# Patient Record
Sex: Female | Born: 1995 | Race: Black or African American | Hispanic: No | Marital: Single | Smoking: Never smoker
Health system: Southern US, Community
[De-identification: ages and names within clinical notes are randomized; demographics above are authoritative.]

## PROBLEM LIST (undated history)

## (undated) ENCOUNTER — Inpatient Hospital Stay: Payer: Self-pay

## (undated) DIAGNOSIS — I10 Essential (primary) hypertension: Secondary | ICD-10-CM

## (undated) DIAGNOSIS — D649 Anemia, unspecified: Secondary | ICD-10-CM

---
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
## 2014-05-19 ENCOUNTER — Ambulatory Visit
Admit: 2014-05-19 | Disposition: A | Discharge status: Home / Self Care | Payer: Self-pay | Attending: Family Medicine | Admitting: Family Medicine

## 2014-05-19 LAB — URINALYSIS, COMPLETE
Bacteria: NEGATIVE
Bilirubin,UR: NEGATIVE
Blood: NEGATIVE
Glucose,UR: NEGATIVE
Ketone: NEGATIVE
Leukocyte Esterase: NEGATIVE
NITRITE: NEGATIVE
PROTEIN: NEGATIVE
Ph: 6 (ref 5.0–8.0)
RBC,UR: NONE SEEN /HPF (ref 0–5)
SPECIFIC GRAVITY: 1.025 (ref 1.000–1.030)
Squamous Epithelial: NONE SEEN

## 2014-05-19 LAB — PREGNANCY, URINE: Pregnancy Test, Urine: NEGATIVE m[IU]/mL

## 2014-05-19 LAB — HCG, QUANTITATIVE, PREGNANCY

## 2015-06-15 DIAGNOSIS — I10 Essential (primary) hypertension: Secondary | ICD-10-CM | POA: Insufficient documentation

## 2015-10-11 ENCOUNTER — Emergency Department
Admission: EM | Admit: 2015-10-11 | Discharge: 2015-10-11 | Disposition: A | Discharge status: Home / Self Care | Payer: BC Managed Care – PPO | Attending: Emergency Medicine | Admitting: Emergency Medicine

## 2015-10-11 ENCOUNTER — Encounter: Payer: Self-pay | Admitting: Emergency Medicine

## 2015-10-11 DIAGNOSIS — Z3A00 Weeks of gestation of pregnancy not specified: Secondary | ICD-10-CM | POA: Insufficient documentation

## 2015-10-11 DIAGNOSIS — R51 Headache: Secondary | ICD-10-CM | POA: Diagnosis not present

## 2015-10-11 DIAGNOSIS — O26899 Other specified pregnancy related conditions, unspecified trimester: Secondary | ICD-10-CM | POA: Insufficient documentation

## 2015-10-11 DIAGNOSIS — Z3201 Encounter for pregnancy test, result positive: Secondary | ICD-10-CM | POA: Diagnosis not present

## 2015-10-11 DIAGNOSIS — R519 Headache, unspecified: Secondary | ICD-10-CM

## 2015-10-11 LAB — CBC WITH DIFFERENTIAL/PLATELET
Basophils Absolute: 0.1 10*3/uL (ref 0–0.1)
Basophils Relative: 1 %
EOS ABS: 0.1 10*3/uL (ref 0–0.7)
EOS PCT: 1 %
HCT: 37.2 % (ref 35.0–47.0)
Hemoglobin: 12.4 g/dL (ref 12.0–16.0)
LYMPHS ABS: 2.3 10*3/uL (ref 1.0–3.6)
Lymphocytes Relative: 36 %
MCH: 25.2 pg — ABNORMAL LOW (ref 26.0–34.0)
MCHC: 33.4 g/dL (ref 32.0–36.0)
MCV: 75.5 fL — ABNORMAL LOW (ref 80.0–100.0)
MONO ABS: 0.4 10*3/uL (ref 0.2–0.9)
Monocytes Relative: 6 %
Neutro Abs: 3.5 10*3/uL (ref 1.4–6.5)
Neutrophils Relative %: 56 %
PLATELETS: 305 10*3/uL (ref 150–440)
RBC: 4.92 MIL/uL (ref 3.80–5.20)
RDW: 15.7 % — ABNORMAL HIGH (ref 11.5–14.5)
WBC: 6.4 10*3/uL (ref 3.6–11.0)

## 2015-10-11 LAB — COMPREHENSIVE METABOLIC PANEL
ALT: 15 U/L (ref 14–54)
ANION GAP: 7 (ref 5–15)
AST: 21 U/L (ref 15–41)
Albumin: 4.3 g/dL (ref 3.5–5.0)
Alkaline Phosphatase: 60 U/L (ref 38–126)
BUN: 11 mg/dL (ref 6–20)
CO2: 25 mmol/L (ref 22–32)
CREATININE: 0.81 mg/dL (ref 0.44–1.00)
Calcium: 9 mg/dL (ref 8.9–10.3)
Chloride: 103 mmol/L (ref 101–111)
GFR calc Af Amer: >60 mL/min (ref >60)
GFR calc non Af Amer: >60 mL/min (ref >60)
Glucose, Bld: 102 mg/dL — ABNORMAL HIGH (ref 65–99)
Potassium: 3.2 mmol/L — ABNORMAL LOW (ref 3.5–5.1)
SODIUM: 135 mmol/L (ref 135–145)
Total Bilirubin: 0.3 mg/dL (ref 0.3–1.2)
Total Protein: 8.2 g/dL — ABNORMAL HIGH (ref 6.5–8.1)

## 2015-10-11 LAB — URINALYSIS COMPLETE WITH MICROSCOPIC (ARMC ONLY)
BACTERIA UA: NONE SEEN
Bilirubin Urine: NEGATIVE
Glucose, UA: NEGATIVE mg/dL
HGB URINE DIPSTICK: NEGATIVE
KETONES UR: NEGATIVE mg/dL
Leukocytes, UA: NEGATIVE
NITRITE: NEGATIVE
PROTEIN: NEGATIVE mg/dL
SPECIFIC GRAVITY, URINE: 1.025 (ref 1.005–1.030)
pH: 6 (ref 5.0–8.0)

## 2015-10-11 LAB — POCT PREGNANCY, URINE
PREG TEST UR: NEGATIVE
Preg Test, Ur: POSITIVE — AB

## 2015-10-11 LAB — HCG, QUANTITATIVE, PREGNANCY: HCG, BETA CHAIN, QUANT, S: 412 m[IU]/mL — AB (ref <5)

## 2015-10-11 MED ORDER — ACETAMINOPHEN 325 MG PO TABS
650.0000 mg | ORAL_TABLET | Freq: Once | ORAL | Status: AC
  Administered 2015-10-11: 650 mg via ORAL

## 2015-10-11 MED ORDER — ACETAMINOPHEN 325 MG PO TABS
ORAL_TABLET | ORAL | Status: AC
  Administered 2015-10-11: 650 mg via ORAL
  Filled 2015-10-11: qty 2

## 2015-10-11 NOTE — ED Notes (Signed)
Discharge instructions reviewed with patient. Patient verbalized understanding. Patient ambulated to lobby without difficulty.   

## 2015-10-11 NOTE — Discharge Instructions (Signed)
You were evaluated for headache and no certain cause is found, although as we discussed your exam and evaluation are reassuring today. I suspect that hormonal changes may be contributing to her headache. Make sure drinking plenty of fluids.  Your pregnancy test was positive. As we discussed, if you have any abdominal pain, vaginal bleeding please come back to the emergency department for further evaluation.  You should follow up with outpatient OB/GYN, and you were provided information for follow-up with Dr. Feliberto GottronSchermerhorn. You should take over-the-counter prenatal vitamins. For pain, you should only be taking Tylenol, you may take as directed on label of over-the-counter Tylenol bottle.  In terms of the headache, return to the emergency department for any fever, neck stiffness, skin rash, worsening headache, vision changes, weakness, numbness, or any other symptoms concerning to you.

## 2015-10-11 NOTE — ED Triage Notes (Signed)
Headache and lightheaded x 3 days. Denies head injury. Denies sinus drainage. Denies fevers. Denies being one sided. Denies photophobia.

## 2015-10-11 NOTE — ED Triage Notes (Signed)
Patient shows faint positive line on pregnancy urine test after 3 minute mark.

## 2015-10-11 NOTE — ED Provider Notes (Signed)
[facility] Emergency Department Provider Note ____________________________________________   I have reviewed the triage vital signs and the triage nursing note.  HISTORY  Chief Complaint Headache   Historian Patient  HPI Jean Wise is a 20 y.o. female here for evaluation of generalized headache which is intermittent for the past 3 days. No photophobia. No sinus congestion. No fever. No vomiting. No weakness or numbness. No vision changes. She has not really tried anything at home for this. Pain is mild to moderate. Nothing makes it worse or better.  Miscarriage in April. Periods had returned.    History reviewed. No pertinent past medical history.  There are no active problems to display for this patient.   History reviewed. No pertinent surgical history.  Prior to Admission medications   Not on File    Allergies no known allergies  No family history on file.  Social History Social History  Substance Use Topics  . Smoking status: Never Smoker  . Smokeless tobacco: Not on file  . Alcohol use No    Review of Systems  Constitutional: Negative for fever. Eyes: Negative for visual changes. ENT: Negative for sore throat. Cardiovascular: Negative for chest pain. Respiratory: Negative for shortness of breath. Gastrointestinal: Negative for abdominal pain, vomiting and diarrhea. Genitourinary: Negative for dysuria. Musculoskeletal: Negative for back pain. Skin: Negative for rash. Neurological: Positive for headache. 10 point Review of Systems otherwise negative ____________________________________________   PHYSICAL EXAM:  VITAL SIGNS: ED Triage Vitals  Enc Vitals Group     BP 10/11/15 1644 (!) 154/80     Pulse Rate 10/11/15 1644 (!) 106     Resp 10/11/15 1644 18     Temp 10/11/15 1644 98.3 F (36.8 C)     Temp Source 10/11/15 1644 Oral     SpO2 10/11/15 1644 100 %     Weight 10/11/15 1646 140 lb (63.5 kg)     Height  10/11/15 1646 5\' 2"  (1.575 m)     Head Circumference --      Peak Flow --      Pain Score 10/11/15 1646 6     Pain Loc --      Pain Edu? --      Excl. in GC? --      Constitutional: Alert and oriented. Well appearing and in no distress. HEENT   Head: Normocephalic and atraumatic.      Eyes: Conjunctivae are normal. PERRL. Normal extraocular movements.      Ears:         Nose: No congestion/rhinnorhea.   Mouth/Throat: Mucous membranes are moist.   Neck: No stridor. No neck stiffness. Cardiovascular/Chest: Normal rate, regular rhythm.  No murmurs, rubs, or gallops. Respiratory: Normal respiratory effort without tachypnea nor retractions. Breath sounds are clear and equal bilaterally. No wheezes/rales/rhonchi. Gastrointestinal: Soft. No distention, no guarding, no rebound. Nontender.    Genitourinary/rectal:Deferred Musculoskeletal: Nontender with normal range of motion in all extremities. No joint effusions.  No lower extremity tenderness.  No edema. Neurologic:  Normal speech and language. No gross or focal neurologic deficits are appreciated. Skin:  Skin is warm, dry and intact. No rash noted. Psychiatric: Mood and affect are normal. Speech and behavior are normal. Patient exhibits appropriate insight and judgment.   ____________________________________________  LABS (pertinent positives/negatives)  Labs Reviewed  HCG, QUANTITATIVE, PREGNANCY - Abnormal; Notable for the following:       Result Value   hCG, Beta Chain, Quant, S 412 (*)    All other  components within normal limits  CBC WITH DIFFERENTIAL/PLATELET - Abnormal; Notable for the following:    MCV 75.5 (*)    MCH 25.2 (*)    RDW 15.7 (*)    All other components within normal limits  COMPREHENSIVE METABOLIC PANEL - Abnormal; Notable for the following:    Potassium 3.2 (*)    Glucose, Bld 102 (*)    Total Protein 8.2 (*)    All other components within normal limits  URINALYSIS COMPLETEWITH MICROSCOPIC  ([facility] ONLY) - Abnormal; Notable for the following:    Color, Urine YELLOW (*)    APPearance CLEAR (*)    Squamous Epithelial / LPF 0-5 (*)    All other components within normal limits  POCT PREGNANCY, URINE - Abnormal; Notable for the following:    Preg Test, Ur POSITIVE (*)    All other components within normal limits  POCT PREGNANCY, URINE  POC URINE PREG, ED    ____________________________________________    EKG I, Governor Rooks, MD, the attending physician have personally viewed and interpreted all ECGs.  None ____________________________________________  RADIOLOGY All Xrays were viewed by me. Imaging interpreted by Radiologist.  None __________________________________________  PROCEDURES  Procedure(s) performed: None  Critical Care performed: None  ____________________________________________   ED COURSE / ASSESSMENT AND PLAN  Pertinent labs & imaging results that were available during my care of the patient were reviewed by me and considered in my medical decision making (see chart for details).  [name] was here for headache, no red flags/high risk features. Headache is mild now. She was found to have positive urine pregnancy test and beta hCG in the 400s. She states that she's had some occasional cramping, but no abdominal pain now. I discussed with her that she is likely early pregnant, and I would not necessarily recommend ultrasound imaging at this point in time with no discomfort. She should follow with OB/GYN and was provided office number for follow-up with Dr. Feliberto Gottron. She previously had been seen at [facility] after miscarriage, and she was recommended that she could go if there are she wanted to.  In terms of the headache, I encouraged her to take Tylenol as is the safest and pregnancy. I don't think she needs any head imaging at this point in time.     CONSULTATIONS:   None   Patient / Family / Caregiver informed of clinical course, medical  decision-making process, and agree with plan.   I discussed return precautions, follow-up instructions, and discharge instructions with patient and/or family.   ___________________________________________   FINAL CLINICAL IMPRESSION(S) / ED DIAGNOSES   Final diagnoses:  Headache, unspecified headache type  Positive blood pregnancy test              Note: This dictation was prepared with Dragon dictation. Any transcriptional errors that result from this process are unintentional    Governor Rooks, MD 10/11/15 1912

## 2016-02-05 ENCOUNTER — Emergency Department: Payer: Medicaid Other

## 2016-02-05 ENCOUNTER — Encounter: Payer: Self-pay | Admitting: Emergency Medicine

## 2016-02-05 ENCOUNTER — Emergency Department
Admission: EM | Admit: 2016-02-05 | Discharge: 2016-02-05 | Disposition: A | Discharge status: Other Acute Inpatient Hospital | Payer: Medicaid Other | Attending: Emergency Medicine | Admitting: Emergency Medicine

## 2016-02-05 DIAGNOSIS — R079 Chest pain, unspecified: Secondary | ICD-10-CM

## 2016-02-05 DIAGNOSIS — Z3492 Encounter for supervision of normal pregnancy, unspecified, second trimester: Secondary | ICD-10-CM

## 2016-02-05 DIAGNOSIS — M545 Low back pain: Secondary | ICD-10-CM | POA: Insufficient documentation

## 2016-02-05 DIAGNOSIS — Z3A20 20 weeks gestation of pregnancy: Secondary | ICD-10-CM | POA: Diagnosis not present

## 2016-02-05 DIAGNOSIS — O26892 Other specified pregnancy related conditions, second trimester: Secondary | ICD-10-CM | POA: Insufficient documentation

## 2016-02-05 DIAGNOSIS — R0602 Shortness of breath: Secondary | ICD-10-CM | POA: Diagnosis not present

## 2016-02-05 LAB — COMPREHENSIVE METABOLIC PANEL
ALT: 15 U/L (ref 14–54)
AST: 22 U/L (ref 15–41)
Albumin: 3.2 g/dL — ABNORMAL LOW (ref 3.5–5.0)
Alkaline Phosphatase: 53 U/L (ref 38–126)
Anion gap: 5 (ref 5–15)
BUN: <5 mg/dL — ABNORMAL LOW (ref 6–20)
CO2: 22 mmol/L (ref 22–32)
Calcium: 8.5 mg/dL — ABNORMAL LOW (ref 8.9–10.3)
Chloride: 108 mmol/L (ref 101–111)
Creatinine, Ser: 0.58 mg/dL (ref 0.44–1.00)
GFR calc non Af Amer: >60 mL/min (ref >60)
Glucose, Bld: 91 mg/dL (ref 65–99)
POTASSIUM: 3.1 mmol/L — AB (ref 3.5–5.1)
Sodium: 135 mmol/L (ref 135–145)
Total Bilirubin: 0.4 mg/dL (ref 0.3–1.2)
Total Protein: 6.4 g/dL — ABNORMAL LOW (ref 6.5–8.1)

## 2016-02-05 LAB — TROPONIN I

## 2016-02-05 LAB — CBC
HCT: 28.9 % — ABNORMAL LOW (ref 35.0–47.0)
Hemoglobin: 10 g/dL — ABNORMAL LOW (ref 12.0–16.0)
MCH: 28.5 pg (ref 26.0–34.0)
MCHC: 34.8 g/dL (ref 32.0–36.0)
MCV: 81.9 fL (ref 80.0–100.0)
PLATELETS: 245 10*3/uL (ref 150–440)
RBC: 3.53 MIL/uL — ABNORMAL LOW (ref 3.80–5.20)
RDW: 14.4 % (ref 11.5–14.5)
WBC: 8.4 10*3/uL (ref 3.6–11.0)

## 2016-02-05 LAB — FIBRIN DERIVATIVES D-DIMER (ARMC ONLY): Fibrin derivatives D-dimer (ARMC): 1641 — ABNORMAL HIGH (ref 0–499)

## 2016-02-05 NOTE — ED Notes (Signed)
NAD noted at this time. Pt resting in bed. This RN notified MD about patient requesting to know if CT would be detrimental to her baby. MD to bedside at this time to discuss with patient. Will continue to monitor for further patient needs.

## 2016-02-05 NOTE — ED Notes (Addendum)
Patient's mom to desk at this time, [facility] at this time. This RN to bedside, [facility]. This RN explained to patient will notify MD at this time. MD noted to not be at desk. Pt visualized in NAD at this time. VSS and WNL. Pt's family at bedside at this time.

## 2016-02-05 NOTE — ED Notes (Signed)
This RN to bedside at this time. Per Dr. Fanny BienQuale pt has been [facility]. This RN informed patient and family. Pt's family asking for estimated time of transfer, this RN informed patient and family that she was unable to provide exact time EMS would arrive, pt and family state understanding at this time. Pt visualized in NAD. Will continue to monitor for further patient needs.

## 2016-02-05 NOTE — ED Notes (Signed)
This RN at bedside at this time. NAD noted. This RN placed 20G to L AC to draw blood. Pt tolerated well. Explained delay to patient. Will continue to monitor for further patient needs.

## 2016-02-05 NOTE — ED Notes (Signed)
Pt resting in bed. Pt given a cup of water per her request, this RN spoke with Dr. Fanny BienQuale regarding patient being able to eat or drink. Per Dr. Fanny BienQuale pt is able to have a cup of water at this time. Pt visualized in NAD, respirations even and unlabored, pt's friends and family at bedside at this time. Will continue to monitor for further patient needs.

## 2016-02-05 NOTE — ED Notes (Signed)
NAD noted at time of transfer. Pt is alert and oriented, respirations even and unlabored. Pt to be transferred via [facility] who [facility] arrived [facility]

## 2016-02-05 NOTE — ED Triage Notes (Signed)
Pt c/o intermittent SOB x1 week, worse today. Pt is [redacted] wks pregnant. Pt is in NAD in triage.

## 2016-02-05 NOTE — ED Provider Notes (Signed)
[facility] Emergency Department Provider Note   ____________________________________________    I have reviewed the triage vital signs and the nursing notes.   HISTORY  Chief Complaint Shortness of Breath     HPI Jean Wise is a 21 y.o. female who presents with complaints of shortness of breath and chest discomfort. Patient reports when she takes deep she has pain in her right chest. She feels very winded with any exertion. She is approximately [redacted] weeks pregnant. She denies recent travel. She denies calf pain or swelling. No fevers or chills or cough. No nausea or vomiting. She has never had before   History reviewed. No pertinent past medical history.  There are no active problems to display for this patient.   History reviewed. No pertinent surgical history.  Prior to Admission medications   Not on File     Allergies Patient has no known allergies.  History reviewed. No pertinent family history.  Social History Social History  Substance Use Topics  . Smoking status: Never Smoker  . Smokeless tobacco: Never Used  . Alcohol use No    Review of Systems  Constitutional: No fever/chills  ENT: No Throat swelling Cardiovascular: As above Respiratory: As above Gastrointestinal: No abdominal pain.  No nausea, no vomiting.    Musculoskeletal: Mild low back pain Skin: Negative for rash. Neurological: Negative for headaches  10-point ROS otherwise negative.  ____________________________________________   PHYSICAL EXAM:  VITAL SIGNS: ED Triage Vitals  Enc Vitals Group     BP 02/05/16 1154 139/86     Pulse Rate 02/05/16 1154 96     Resp 02/05/16 1154 20     Temp 02/05/16 1154 98.4 F (36.9 C)     Temp Source 02/05/16 1154 Oral     SpO2 02/05/16 1154 100 %     Weight 02/05/16 1156 158 lb (71.7 kg)     Height 02/05/16 1156 5\' 2"  (1.575 m)     Head Circumference --      Peak Flow --      Pain Score --      Pain Loc  --      Pain Edu? --      Excl. in GC? --     Constitutional: Alert and oriented. No acute distress. Pleasant and interactive Eyes: Conjunctivae are normal.   Nose: No congestion/rhinnorhea. Mouth/Throat: Mucous membranes are moist.    Cardiovascular: Tachycardia, regular rhythm. Grossly normal heart sounds.  Good peripheral circulation. Respiratory: Mildly increased respiratory effort with mild tachypnea  No retractions. Lungs CTAB. Gastrointestinal: Soft and nontender. No distention.  No CVA tenderness. Genitourinary: deferred Musculoskeletal: No lower extremity tenderness nor edema.  Warm and well perfused Neurologic:  Normal speech and language. No gross focal neurologic deficits are appreciated.  Skin:  Skin is warm, dry and intact. No rash noted. Psychiatric: Mood and affect are normal. Speech and behavior are normal.  ____________________________________________   LABS (all labs ordered are listed, but only abnormal results are displayed)  Labs Reviewed  CBC - Abnormal; Notable for the following:       Result Value   RBC 3.53 (*)    Hemoglobin 10.0 (*)    HCT 28.9 (*)    All other components within normal limits  COMPREHENSIVE METABOLIC PANEL - Abnormal; Notable for the following:    Potassium 3.1 (*)    BUN <5 (*)    Calcium 8.5 (*)    Total Protein 6.4 (*)    Albumin  3.2 (*)    All other components within normal limits  FIBRIN DERIVATIVES D-DIMER ([facility] ONLY) - Abnormal; Notable for the following:    Fibrin derivatives D-dimer [facility]) 1,641 (*)    All other components within normal limits  TROPONIN I   ____________________________________________  EKG  ED ECG REPORT I, Jene Every, the attending physician, personally viewed and interpreted this ECG.  Date: 02/05/2016 EKG Time: 12:04 PM Rate: 95 Rhythm: normal sinus rhythm QRS Axis: normal Intervals: normal ST/T Wave abnormalities:  Nonspecific   ____________________________________________  RADIOLOGY  Chest x-ray unremarkable ____________________________________________   PROCEDURES  Procedure(s) performed: No    Critical Care performed: No ____________________________________________   INITIAL IMPRESSION / ASSESSMENT AND PLAN / ED COURSE  Pertinent labs & imaging results that were available during my care of the patient were reviewed by me and considered in my medical decision making (see chart for details).  Patient presents with shortness of breath and pleurisy. Chest x-ray is unremarkable. Lab work is reassuring except for an elevated d-dimer.   Clinical Course    ___----------------------------------------- 3:06 PM on 02/05/2016 -----------------------------------------  Patient continues to complain of pleurisy and shortness of breath. Given her elevated d-dimer and description of pleurisy and initial tachycardia I discussed with her whether to proceed with further imaging. We discussed increased radiation exposure to fetus and risks and benefits. Via shared decision making mother decided to proceed with CT scan.   I will ask Dr. Fanny Bien to follow up on Ct results and dispo as appropriate.    _________________________________________   FINAL CLINICAL IMPRESSION(S) / ED DIAGNOSES  Shortness of breath.    NEW MEDICATIONS STARTED DURING THIS VISIT:  New Prescriptions   No medications on file     Note:  This document was prepared using Dragon voice recognition software and may include unintentional dictation errors.    Jene Every, MD 02/05/16 [phone]

## 2016-02-05 NOTE — ED Notes (Signed)
This RN notified CT scan that patient was continuing to refuse CT until her mom arrived, will continue to wait for patient's mother to arrive before decision is made.

## 2016-02-05 NOTE — ED Notes (Signed)
This RN called report to Shanda BumpsJessica, Charity fundraiserN.

## 2016-02-05 NOTE — ED Provider Notes (Signed)
The patient refused CAT scan. She does wish for further workup of her chest pain, and understands the reason and that we are offering evaluation here including a CT scan of the chest to make sure she does not have a "blood clot" as a cause or chest pain. The patient reports that due to previous family experience with our hospital they will refuse further care here and they're [facility]. Neither the patient or her family seem upset with the care provided in the ER here tonight, but rather reports that they are much more comfortable receiving further care given her being [facility].  I discussed with ER attending, [facility]. Elesa [facility]ackerJonathan [facility]. He is accepted the patient in transfer, she will be transferred and is agreeable to transfer by ambulance BLS level. She appears stable for transfer at this time, has no oxygen requirement, is resting comfortably, able to speak on her phone. Lungs are clear. Stable hemodynamics.  Vitals:   02/05/16 1700 02/05/16 1730  BP: (!) 149/84 (!) 143/78  Pulse: 93 86  Resp: 17 14  Temp:      ----------------------------------------- 6:06 PM on 02/05/2016 -----------------------------------------  [facility], [facility] [place] [facility] for further workup of chest pain and pregnancy.   Sharyn CreamerMark Laycee Fitzsimmons, MD 02/05/16 1806

## 2016-02-05 NOTE — ED Notes (Signed)
Pt resting in bed at this time. Pt given a food tray by April, RN. Per Dr. Fanny BienQuale, pt is okay to eat at this time. NAD noted at this time.

## 2016-02-05 NOTE — ED Notes (Signed)
This RN explained delay and apologized to patient for wait. Pt states understanding. Pt given 2 warm sheets (explained to patient that we are out of blankets). Pt visualized in NAD at this time. Will continue to monitor for further patient needs.

## 2016-05-24 ENCOUNTER — Observation Stay
Admission: EM | Admit: 2016-05-24 | Discharge: 2016-05-24 | Disposition: A | Discharge status: Home / Self Care | Payer: Medicaid Other | Attending: Obstetrics and Gynecology | Admitting: Obstetrics and Gynecology

## 2016-05-24 DIAGNOSIS — O10911 Unspecified pre-existing hypertension complicating pregnancy, first trimester: Principal | ICD-10-CM | POA: Insufficient documentation

## 2016-05-24 DIAGNOSIS — Z6834 Body mass index (BMI) 34.0-34.9, adult: Secondary | ICD-10-CM | POA: Insufficient documentation

## 2016-05-24 DIAGNOSIS — E663 Overweight: Secondary | ICD-10-CM | POA: Diagnosis not present

## 2016-05-24 DIAGNOSIS — Z3A08 8 weeks gestation of pregnancy: Secondary | ICD-10-CM | POA: Diagnosis not present

## 2016-05-24 MED ORDER — ACETAMINOPHEN 325 MG PO TABS: 650.0000 mg | ORAL_TABLET | ORAL | Status: DC | PRN

## 2016-05-24 MED ORDER — LACTATED RINGERS IV SOLN: 500.0000 mL | INTRAVENOUS | Status: DC | PRN

## 2016-05-24 MED ORDER — OXYCODONE-ACETAMINOPHEN 5-325 MG PO TABS: 1.0000 | ORAL_TABLET | ORAL | Status: DC | PRN

## 2016-05-24 NOTE — OB Triage Note (Signed)
Jean Wise here with c/o LOF last night at 2300, stated she got nauseated, vomited and while she was vomiting a gush of fluid came out, since then , she reports "white stuff" coming out. Denies bleeding, reports positive fetal movement. Last intercourse yesterday AM.

## 2016-05-24 NOTE — Discharge Instructions (Signed)
Braxton Hicks Contractions °Contractions of the uterus can occur throughout pregnancy, but they are not always a sign that you are in labor. You may have practice contractions called Braxton Hicks contractions. These false labor contractions are sometimes confused with true labor. °What are Braxton Hicks contractions? °Braxton Hicks contractions are tightening movements that occur in the muscles of the uterus before labor. Unlike true labor contractions, these contractions do not result in opening (dilation) and thinning of the cervix. Toward the end of pregnancy (32-34 weeks), Braxton Hicks contractions can happen more often and may become stronger. These contractions are sometimes difficult to tell apart from true labor because they can be very uncomfortable. You should not feel embarrassed if you go to the hospital with false labor. °Sometimes, the only way to tell if you are in true labor is for your health care provider to look for changes in the cervix. The health care provider will do a physical exam and may monitor your contractions. If you are not in true labor, the exam should show that your cervix is not dilating and your water has not broken. °If there are no prenatal problems or other health problems associated with your pregnancy, it is completely safe for you to be sent home with false labor. You may continue to have Braxton Hicks contractions until you go into true labor. °How can I tell the difference between true labor and false labor? °· Differences °¨ False labor °¨ Contractions last 30-70 seconds.: Contractions are usually shorter and not as strong as true labor contractions. °¨ Contractions become very regular.: Contractions are usually irregular. °¨ Discomfort is usually felt in the top of the uterus, and it spreads to the lower abdomen and low back.: Contractions are often felt in the front of the lower abdomen and in the groin. °¨ Contractions do not go away with walking.: Contractions may  go away when you walk around or change positions while lying down. °¨ Contractions usually become more intense and increase in frequency.: Contractions get weaker and are shorter-lasting as time goes on. °¨ The cervix dilates and gets thinner.: The cervix usually does not dilate or become thin. °Follow these instructions at home: °¨ Take over-the-counter and prescription medicines only as told by your health care provider. °¨ Keep up with your usual exercises and follow other instructions from your health care provider. °¨ Eat and drink lightly if you think you are going into labor. °¨ If Braxton Hicks contractions are making you uncomfortable: °¨ Change your position from lying down or resting to walking, or change from walking to resting. °¨ Sit and rest in a tub of warm water. °¨ Drink enough fluid to keep your urine clear or pale yellow. Dehydration may cause these contractions. °¨ Do slow and deep breathing several times an hour. °¨ Keep all follow-up prenatal visits as told by your health care provider. This is important. °Contact a health care provider if: °¨ You have a fever. °¨ You have continuous pain in your abdomen. °Get help right away if: °¨ Your contractions become stronger, more regular, and closer together. °¨ You have fluid leaking or gushing from your vagina. °¨ You pass blood-tinged mucus (bloody show). °¨ You have bleeding from your vagina. °¨ You have low back pain that you never had before. °¨ You feel your baby’s head pushing down and causing pelvic pressure. °¨ Your baby is not moving inside you as much as it used to. °Summary °¨ Contractions that occur before labor are   called Braxton Hicks contractions, false labor, or practice contractions.  Braxton Hicks contractions are usually shorter, weaker, farther apart, and less regular than true labor contractions. True labor contractions usually become progressively stronger and regular and they become more frequent.  Manage discomfort from  [facility] contractions by changing position, resting in a warm bath, drinking plenty of water, or practicing deep breathing. This information is not intended to replace advice given to you by your health care provider. Make sure you discuss any questions you have with your health care provider. Document Released: 01/16/2005 Document Revised: 12/06/2015 Document Reviewed: 12/06/2015 Elsevier Interactive Patient Education  2017 [employer]   Vaginal Delivery Vaginal delivery means that you will give birth by pushing your baby out of your birth canal (vagina). A team of health care providers will help you before, during, and after vaginal delivery. Birth experiences are unique for every woman and every pregnancy, and birth experiences vary depending on where you choose to give birth. What should I do to prepare for my baby's birth? Before your baby is born, it is important to talk with your health care provider about:  Your labor and delivery preferences. These may include:  Medicines that you may be given.  How you will manage your pain. This might include non-medical pain relief techniques or injectable pain relief such as epidural analgesia.  How you and your baby will be monitored during labor and delivery.  Who may be in the labor and delivery room with you.  Your feelings about surgical delivery of your baby (cesarean delivery, or C-section) if this becomes necessary.  Your feelings about receiving donated blood through an IV tube (blood transfusion) if this becomes necessary.  Whether you are able:  To take pictures or videos of the birth.  To eat during labor and delivery.  To move around, walk, or change positions during labor and delivery.  What to expect after your baby is born, such as:  Whether delayed umbilical cord clamping and cutting is offered.  Who will care for your baby right after birth.  Medicines or tests that may be recommended for your  baby.  Whether breastfeeding is supported in your hospital or birth center.  How [place] you will be in the hospital or birth center.  How any medical conditions you have may affect your baby or your labor and delivery experience. To prepare for your baby's birth, you should also:  Attend all of your health care visits before delivery (prenatal visits) as recommended by your health care provider. This is important.  Prepare your home for your baby's arrival. Make sure that you have:  Diapers.  Baby clothing.  Feeding equipment.  Safe sleeping arrangements for you and your baby.  Install a car seat in your vehicle. Have your car seat checked by a certified car seat installer to make sure that it is installed safely.  Think about who will help you with your new baby at home for at least the first several weeks after delivery. What can I expect when I arrive at the birth center or hospital? Once you are in labor and have been admitted into the hospital or birth center, your health care provider may:  Review your pregnancy history and any concerns you have.  Insert an IV tube into one of your veins. This is used to give you fluids and medicines.  Check your blood pressure, pulse, temperature, and heart rate (vital signs).  Check whether your bag of water (amniotic  sac) has broken (ruptured).  Talk with you about your birth plan and discuss pain control options. Monitoring  Your health care provider may monitor your contractions (uterine monitoring) and your baby's heart rate (fetal monitoring). You may need to be monitored:  Often, but not continuously (intermittently).  All the time or for [place] periods at a time (continuously). Continuous monitoring may be needed if:  You are taking certain medicines, such as medicine to relieve pain or make your contractions stronger.  You have pregnancy or labor complications. Monitoring may be done by:  Placing a special stethoscope or a  handheld monitoring device on your abdomen to check your baby's heartbeat, and feeling your abdomen for contractions. This method of monitoring does not continuously record your baby's heartbeat or your contractions.  Placing monitors on your abdomen (external monitors) to record your baby's heartbeat and the frequency and length of contractions. You may not have to wear external monitors all the time.  Placing monitors inside of your uterus (internal monitors) to record your baby's heartbeat and the frequency, length, and strength of your contractions.  Your health care provider may use internal monitors if he or she needs more information about the strength of your contractions or your baby's heart rate.  Internal monitors are put in place by passing a thin, flexible wire through your vagina and into your uterus. Depending on the type of monitor, it may remain in your uterus or on your baby's head until birth.  Your health care provider will discuss the benefits and risks of internal monitoring with you and will ask for your permission before inserting the monitors.  Telemetry. This is a type of continuous monitoring that can be done with external or internal monitors. Instead of having to stay in bed, you are able to move around during telemetry. Ask your health care provider if telemetry is an option for you. Physical exam  Your health care provider may perform a physical exam. This may include:  Checking whether your baby is positioned:  With the head toward your vagina (head-down). This is most common.  With the head toward the top of your uterus (head-up or breech). If your baby is in a breech position, your health care provider may try to turn your baby to a head-down position so you can deliver vaginally. If it does not seem that your baby can be born vaginally, your provider may recommend surgery to deliver your baby. In rare cases, you may be able to deliver vaginally if your baby is  head-up (breech delivery).  Lying sideways (transverse). Babies that are lying sideways cannot be delivered vaginally.  Checking your cervix to determine:  Whether it is thinning out (effacing).  Whether it is opening up (dilating).  How low your baby has moved into your birth canal. What are the three stages of labor and delivery?   Normal labor and delivery is divided into the following three stages: Stage 1   Stage 1 is the longest stage of labor, and it can last for hours or days. Stage 1 includes:  Early labor. This is when contractions may be irregular, or regular and mild. Generally, early labor contractions are more than 10 minutes apart.  Active labor. This is when contractions get longer, more regular, more frequent, and more intense.  The transition phase. This is when contractions happen very close together, are very intense, and may last longer than during any other part of labor.  Contractions generally feel mild, infrequent, and  irregular at first. They get stronger, more frequent (about every 2-3 minutes), and more regular as you progress from early labor through active labor and transition.  Many women progress through stage 1 naturally, but you may need help to continue making progress. If this happens, your health care provider may talk with you about:  Rupturing your amniotic sac if it has not ruptured yet.  Giving you medicine to help make your contractions stronger and more frequent.  Stage 1 ends when your cervix is completely dilated to 4 inches (10 cm) and completely effaced. This happens at the end of the transition phase. Stage 2   Once your cervix is completely effaced and dilated to 4 inches (10 cm), you may start to feel an urge to push. It is common for the body to naturally take a rest before feeling the urge to push, especially if you received an epidural or certain other pain medicines. This rest period may last for up to 1-2 hours, depending on  your unique labor experience.  During stage 2, contractions are generally less painful, because pushing helps relieve contraction pain. Instead of contraction pain, you may feel stretching and burning pain, especially when the widest part of your baby's head passes through the vaginal opening (crowning).  Your health care provider will closely monitor your pushing progress and your baby's progress through the vagina during stage 2.  Your health care provider may massage the area of skin between your vaginal opening and anus (perineum) or apply warm compresses to your perineum. This helps it stretch as the baby's head starts to crown, which can help prevent perineal tearing.  In some cases, an incision may be made in your perineum (episiotomy) to allow the baby to pass through the vaginal opening. An episiotomy helps to make the opening of the vagina larger to allow more room for the baby to fit through.  It is very important to breathe and focus so your health care provider can control the delivery of your baby's head. Your health care provider may have you decrease the intensity of your pushing, to help prevent perineal tearing.  After delivery of your baby's head, the shoulders and the rest of the body generally deliver very quickly and without difficulty.  Once your baby is delivered, the umbilical cord may be cut right away, or this may be delayed for 1-2 minutes, depending on your baby's health. This may vary among health care providers, hospitals, and birth centers.  If you and your baby are healthy enough, your baby may be placed on your chest or abdomen to help maintain the baby's temperature and to help you bond with each other. Some mothers and babies start breastfeeding at this time. Your health care team will dry your baby and help keep your baby warm during this time.  Your baby may need immediate care if he or she:  Showed signs of distress during labor.  Has a medical  condition.  Was born too early (prematurely).  Had a bowel movement before birth (meconium).  Shows signs of difficulty transitioning from being inside the uterus to being outside of the uterus. If you are planning to breastfeed, your health care team will help you begin a feeding. Stage 3   The third stage of labor starts immediately after the birth of your baby and ends after you deliver the placenta. The placenta is an organ that develops during pregnancy to provide oxygen and nutrients to your baby in the womb.  Delivering the placenta may require some pushing, and you may have mild contractions. Breastfeeding can stimulate contractions to help you deliver the placenta.  After the placenta is delivered, your uterus should tighten (contract) and become firm. This helps to stop bleeding in your uterus. To help your uterus contract and to control bleeding, your health care provider may:  Give you medicine by injection, through an IV tube, by mouth, or through your rectum (rectally).  Massage your abdomen or perform a vaginal exam to remove any blood clots that are left in your uterus.  Empty your bladder by placing a thin, flexible tube (catheter) into your bladder.  Encourage you to breastfeed your baby. After labor is over, you and your baby will be monitored closely to ensure that you are both healthy until you are ready to go home. Your health care team will teach you how to care for yourself and your baby. This information is not intended to replace advice given to you by your health care provider. Make sure you discuss any questions you have with your health care provider. Document Released: 10/26/2007 Document Revised: 08/06/2015 Document Reviewed: 01/31/2015 Elsevier Interactive Patient Education  2017 [employer] Inc.   Fetal Movement Counts Patient Name: ________________________________________________ Patient Due Date: ____________________ What is a fetal movement count? A  fetal movement count is the number of times that you feel your baby move during a certain amount of time. This may also be called a fetal kick count. A fetal movement count is recommended for every pregnant woman. You may be asked to start counting fetal movements as early as week 28 of your pregnancy. Pay attention to when your baby is most active. You may notice your baby's sleep and wake cycles. You may also notice things that make your baby move more. You should do a fetal movement count:  When your baby is normally most active.  At the same time each day. A good time to count movements is while you are resting, after having something to eat and drink. How do I count fetal movements? 1. Find a quiet, comfortable area. Sit, or lie down on your side. 2. Write down the date, the start time and stop time, and the number of movements that you felt between those two times. Take this information with you to your health care visits. 3. For 2 hours, count kicks, flutters, swishes, rolls, and jabs. You should feel at least 10 movements during 2 hours. 4. You may stop counting after you have felt 10 movements. 5. If you do not feel 10 movements in 2 hours, have something to eat and drink. Then, keep resting and counting for 1 hour. If you feel at least 4 movements during that hour, you may stop counting. Contact a health care provider if:  You feel fewer than 4 movements in 2 hours.  Your baby is not moving like he or she usually does. Date: ____________ Start time: ____________ Stop time: ____________ Movements: ____________ Date: ____________ Start time: ____________ Stop time: ____________ Movements: ____________ Date: ____________ Start time: ____________ Stop time: ____________ Movements: ____________ Date: ____________ Start time: ____________ Stop time: ____________ Movements: ____________ Date: ____________ Start time: ____________ Stop time: ____________ Movements: ____________ Date:  ____________ Start time: ____________ Stop time: ____________ Movements: ____________ Date: ____________ Start time: ____________ Stop time: ____________ Movements: ____________ Date: ____________ Start time: ____________ Stop time: ____________ Movements: ____________ Date: ____________ Start time: ____________ Stop time: ____________ Movements: ____________ This information is not intended to replace advice given  to you by your health care provider. Make sure you discuss any questions you have with your health care provider. Document Released: 02/15/2006 Document Revised: 09/15/2015 Document Reviewed: 02/25/2015 Elsevier Interactive Patient Education  2017 [employer]. Labor signs and symptoms

## 2016-05-24 NOTE — H&P (Signed)
HPI:  Thedora Rings Roberge is a 21 y.o. G2P0010 at [redacted]w[redacted]d dated by (LMP) who presents today for "LOF this am". No UC, VB or decreased FM.    Pregnancy Complicated by:  CHTN - in past yr has had high BPs (150-160's/ 80-90's), no tx  Past Medical History:  Diagnosis Date  . Hypertension  no medications  . Overweight   Past Surgical History:  Procedure Laterality Date  . WISDOM TOOTH EXTRACTION   OB History  Gravida Para Term Preterm AB Living  2 1  SAB TAB Ectopic Molar Multiple Live Births  1   # Outcome Date GA Lbr Len/2nd Weight Sex Delivery Anes PTL Lv  2 Current  1 SAB 05/2015 SAB    GYN Hx:  No hx of abnormal paps - never had one. No STIs- negative testing On 10/20/15  Current Outpatient Prescriptions:  . multivitamin, prenatal, folic acid-iron, 27-1 mg Tab, Take 1 tablet by mouth daily., Disp: , Rfl:  . doxylamine-pyridoxine, vit B6, (DICLEGIS) 10-10 mg tablet, 2 tabs po qhs, may increase to add 1 tab in AM and 1 tab in PM, Disp: 30 tablet, Rfl: 3 . triamcinolone (KENALOG) 0.1 % cream, Apply topically Two (2) times a day., Disp: 30 g, Rfl: 0  No Known Allergies  Family History  Problem Relation Age of Onset  . Hypertension Mother  . Hypertension Maternal Grandfather  . Cancer Maternal Grandfather  . Hypertension Maternal Grandmother  . Cancer Maternal Grandmother  . Hypertension Paternal Grandfather  . Cancer Paternal Grandfather  . Hypertension Paternal Grandmother  . Cancer Paternal Grandmother   Birth defects: none  Social History  Substance Use Topics  . Smoking status: Never Smoker  . Smokeless tobacco: Never Used  . Alcohol use No    EXAM: BP 133/76  Pulse 99  Wt 73.8 kg (162 lb 9.6 oz)  LMP 09/08/2015 Comment: Sure, regular menses, no contraception for >6 months prior  BMI 30.72 kg/m  Body mass index is 30.72 kg/m at start of pregnancy  General Appearance No acute distress, well appearing and well nourished.  Eyes Pupils equal and round.  Extraocular muscles intact and sclera clear.  ENT Well hydrated moist mucous membranes  Neck Supple without any enlargements, no thyromegaly, or JVD.  Lymph Nodes No adenopathy(cervical, axillary, inguinal)  Pulmonary Clear to auscultation bilaterally, without wheezes/crackles/rhonchi. Good air movement.  Cardiovascular Pulse normal rate, regularity and rhythm. S1 and S2 normal, 2/6 systolic murmur.  Abdomen:Gravod  Genitourinary normal external genitalia, vulva, vagina, cervix, uterus and adnexa  SVE:Cx is Wilsey/closed/post Extremities No rash, lesions or petechiae. No bilateral cyanosis, clubbing. Edema none  Skin Skin color, texture, turgor normal, no rash/lesions/breakdown  Neurologic Alert and oriented to person, place, and time. Cranial nerves II-XII grossly intact, normal gait. DTR 2+  Psychiatric Mood and affect within normal limits   Review of systems is negative other than per HPI. +LOF only  Speculum exam done using sterile technique. No fluid seen with cough on exam from cx. Cx is closed and thin white creamy dc presnt. No vag odor,  IMPRESSION: Adiva Hydia Glaspy is a 21 y.o. G2P0010 at [redacted]w[redacted]d dated by (LMP) who presents today tfor "LOF" this am" Nitrazine neg and Neg ferning noted today  PLAN:  1) DC home to monitor in case SROM occurs. 2) FU at [facility] as scheduled 3) [facility]KC's.   2) Fetal surveillance:  Advised fetal kick counts daily: NST reactive with 2 accels 15 x 15 BPM  3) Disc S/S  of labor and to RTH with symptoms.  4)CHTN:.143/88 today. FU for any med coverage at [facility], RN, MSN, CNM, FNP

## 2016-05-24 NOTE — Discharge Summary (Signed)
Obstetric Discharge Summary   Patient ID: Jean Wise MRN: [id number] DOB/AGE: 1995-12-26 21 y.o.   Date of Admission: 05/24/2016  Date of Discharge: 05/24/16  Admitting Diagnosis: Observation at [redacted]w[redacted]d  Secondary Diagnosis: Chronic hypertension   Mode of Delivery:not delivered     Discharge Diagnosis: ROM ruled out   Intrapartum Procedures: N/A   Post partum procedures: N/A  Complications: none   Brief Hospital Course  Jean Wise is a G1P0 who presented for "LOF" and was ruled out today Labs: CBC Latest Ref Rng & Units 02/05/2016 10/11/2015  WBC 3.6 - 11.0 K/uL 8.4 6.4  Hemoglobin 12.0 - 16.0 g/dL 10.0(L) 12.4  Hematocrit 35.0 - 47.0 % 28.9(L) 37.2  Platelets 150 - 440 K/uL 245 305    Physical exam:  Temperature 98.3 F (36.8 C), temperature source Oral, resp. rate 16, height  (1.549 m), weight 180 lb (81.6 kg), last menstrual period 09/08/2015. General: alert and no distress Abdomen: Gravid Extremities: No evidence of DVT seen on physical exam. No lower extremity edema.  Discharge Instructions: Per After Visit Summary. Activity: Advance as tolerated. Pelvic rest for 6 weeks.  Also refer to After Visit Summary Diet: Regular Medications:PNV  Outpatient follow up:  Postpartum contraception:   Discharged Condition:   Discharged to: Home   Newborn Data:  None delivered yet  Disposition:Home  Apgars: APGAR (1 MIN): This patient has no babies on file.  APGAR (5 MINS): This patient has no babies on file.  APGAR (10 MINS): This patient has no babies on file.    Sharee Pimple, CNM 05/24/2016

## 2016-06-24 ENCOUNTER — Emergency Department
Admission: EM | Admit: 2016-06-24 | Discharge: 2016-06-24 | Disposition: A | Discharge status: Home / Self Care | Payer: Medicaid Other | Attending: Emergency Medicine | Admitting: Emergency Medicine

## 2016-06-24 ENCOUNTER — Encounter: Payer: Self-pay | Admitting: Emergency Medicine

## 2016-06-24 DIAGNOSIS — Y939 Activity, unspecified: Secondary | ICD-10-CM | POA: Insufficient documentation

## 2016-06-24 DIAGNOSIS — Y92410 Unspecified street and highway as the place of occurrence of the external cause: Secondary | ICD-10-CM | POA: Diagnosis not present

## 2016-06-24 DIAGNOSIS — Z79899 Other long term (current) drug therapy: Secondary | ICD-10-CM | POA: Insufficient documentation

## 2016-06-24 DIAGNOSIS — M545 Low back pain: Secondary | ICD-10-CM | POA: Diagnosis present

## 2016-06-24 DIAGNOSIS — S299XXA Unspecified injury of thorax, initial encounter: Secondary | ICD-10-CM

## 2016-06-24 DIAGNOSIS — I10 Essential (primary) hypertension: Secondary | ICD-10-CM | POA: Diagnosis not present

## 2016-06-24 DIAGNOSIS — S39012A Strain of muscle, fascia and tendon of lower back, initial encounter: Secondary | ICD-10-CM | POA: Diagnosis not present

## 2016-06-24 DIAGNOSIS — Y998 Other external cause status: Secondary | ICD-10-CM | POA: Insufficient documentation

## 2016-06-24 HISTORY — DX: Essential (primary) hypertension: I10

## 2016-06-24 MED ORDER — IBUPROFEN 600 MG PO TABS
600.0000 mg | ORAL_TABLET | Freq: Once | ORAL | Status: AC
  Administered 2016-06-24: 600 mg via ORAL
  Filled 2016-06-24: qty 1
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
## 2017-11-07 DIAGNOSIS — Z8742 Personal history of other diseases of the female genital tract: Secondary | ICD-10-CM | POA: Insufficient documentation

## 2017-12-05 ENCOUNTER — Emergency Department: Payer: Medicaid Other

## 2017-12-05 ENCOUNTER — Encounter: Payer: Self-pay | Admitting: Emergency Medicine

## 2017-12-05 ENCOUNTER — Other Ambulatory Visit: Payer: Self-pay

## 2017-12-05 ENCOUNTER — Emergency Department
Admission: EM | Admit: 2017-12-05 | Discharge: 2017-12-05 | Disposition: A | Discharge status: Home / Self Care | Payer: Medicaid Other | Attending: Emergency Medicine | Admitting: Emergency Medicine

## 2017-12-05 DIAGNOSIS — Z3A12 12 weeks gestation of pregnancy: Secondary | ICD-10-CM | POA: Diagnosis not present

## 2017-12-05 DIAGNOSIS — R29898 Other symptoms and signs involving the musculoskeletal system: Secondary | ICD-10-CM

## 2017-12-05 DIAGNOSIS — R519 Headache, unspecified: Secondary | ICD-10-CM

## 2017-12-05 DIAGNOSIS — O219 Vomiting of pregnancy, unspecified: Secondary | ICD-10-CM | POA: Diagnosis not present

## 2017-12-05 DIAGNOSIS — E876 Hypokalemia: Secondary | ICD-10-CM

## 2017-12-05 DIAGNOSIS — R51 Headache: Secondary | ICD-10-CM | POA: Insufficient documentation

## 2017-12-05 DIAGNOSIS — R531 Weakness: Secondary | ICD-10-CM | POA: Insufficient documentation

## 2017-12-05 DIAGNOSIS — O9989 Other specified diseases and conditions complicating pregnancy, childbirth and the puerperium: Secondary | ICD-10-CM | POA: Insufficient documentation

## 2017-12-05 DIAGNOSIS — O210 Mild hyperemesis gravidarum: Secondary | ICD-10-CM

## 2017-12-05 DIAGNOSIS — I10 Essential (primary) hypertension: Secondary | ICD-10-CM | POA: Diagnosis not present

## 2017-12-05 LAB — CBC
HCT: 32.9 % — ABNORMAL LOW (ref 36.0–46.0)
Hemoglobin: 11.1 g/dL — ABNORMAL LOW (ref 12.0–15.0)
MCH: 25.8 pg — ABNORMAL LOW (ref 26.0–34.0)
MCHC: 33.7 g/dL (ref 30.0–36.0)
MCV: 76.5 fL — ABNORMAL LOW (ref 80.0–100.0)
PLATELETS: 298 10*3/uL (ref 150–400)
RBC: 4.3 MIL/uL (ref 3.87–5.11)
RDW: 13.9 % (ref 11.5–15.5)
WBC: 5.8 10*3/uL (ref 4.0–10.5)
nRBC: 0 % (ref 0.0–0.2)

## 2017-12-05 LAB — COMPREHENSIVE METABOLIC PANEL
ALT: 23 U/L (ref 0–44)
AST: 35 U/L (ref 15–41)
Albumin: 3.7 g/dL (ref 3.5–5.0)
Alkaline Phosphatase: 64 U/L (ref 38–126)
Anion gap: 11 (ref 5–15)
BUN: 10 mg/dL (ref 6–20)
CHLORIDE: 101 mmol/L (ref 98–111)
CO2: 23 mmol/L (ref 22–32)
CREATININE: 0.54 mg/dL (ref 0.44–1.00)
Calcium: 9.4 mg/dL (ref 8.9–10.3)
GFR calc non Af Amer: >60 mL/min (ref >60)
Glucose, Bld: 103 mg/dL — ABNORMAL HIGH (ref 70–99)
Potassium: 3.2 mmol/L — ABNORMAL LOW (ref 3.5–5.1)
SODIUM: 135 mmol/L (ref 135–145)
Total Bilirubin: 0.3 mg/dL (ref 0.3–1.2)
Total Protein: 7.8 g/dL (ref 6.5–8.1)

## 2017-12-05 MED ORDER — ACETAMINOPHEN 500 MG PO TABS
1000.0000 mg | ORAL_TABLET | Freq: Once | ORAL | Status: AC
  Administered 2017-12-05: 1000 mg via ORAL
  Filled 2017-12-05: qty 2

## 2017-12-05 MED ORDER — POTASSIUM CHLORIDE 10 MEQ/100ML IV SOLN
10.0000 meq | INTRAVENOUS | Status: AC
  Administered 2017-12-05 (×2): 10 meq via INTRAVENOUS
  Filled 2017-12-05 (×2): qty 100

## 2017-12-05 MED ORDER — SODIUM CHLORIDE 0.9 % IV BOLUS
1000.0000 mL | Freq: Once | INTRAVENOUS | Status: AC
  Administered 2017-12-05: 1000 mL via INTRAVENOUS

## 2017-12-05 MED ORDER — NIFEDIPINE ER OSMOTIC RELEASE 30 MG PO TB24
30.0000 mg | ORAL_TABLET | Freq: Once | ORAL | Status: AC
  Administered 2017-12-05: 30 mg via ORAL
  Filled 2017-12-05: qty 1

## 2017-12-05 MED ORDER — FENTANYL CITRATE (PF) 100 MCG/2ML IJ SOLN
50.0000 ug | Freq: Once | INTRAMUSCULAR | Status: AC
  Administered 2017-12-05: 50 ug via INTRAVENOUS
  Filled 2017-12-05: qty 2

## 2017-12-05 MED ORDER — ONDANSETRON HCL 4 MG/2ML IJ SOLN
4.0000 mg | Freq: Once | INTRAMUSCULAR | Status: AC
  Administered 2017-12-05: 4 mg via INTRAVENOUS
  Filled 2017-12-05: qty 2

## 2017-12-05 MED ORDER — POTASSIUM CHLORIDE CRYS ER 20 MEQ PO TBCR
40.0000 meq | EXTENDED_RELEASE_TABLET | Freq: Once | ORAL | Status: DC
  Filled 2017-12-05: qty 2

## 2017-12-05 NOTE — ED Provider Notes (Addendum)
[facility] Emergency Department Provider Note  ____________________________________________  Time seen: Approximately 10:31 AM  I have reviewed the triage vital signs and the nursing notes.   HISTORY  Chief Complaint Headache    HPI Jean Wise is a 22 y.o. female G4P1 A1 [redacted] weeks pregnant with twins resenting for bilateral leg weakness.  The patient reports that since the onset of pregnancy she has been having a frontal headache with hyperemesis gravidarum.  She has had an 8 pound weight loss since her last visit.  She reports that she has ongoing nausea and vomiting, but does not take any medication although her last OB visit from 11/29/2017 did discharge with instructions to take likely just and ranitidine.  Reports that she has a frontal headache with photosensitivity, which comes and goes but she feels that it has been worse recently.  This is not a thunderclap headache; not worst headache of life.  She has tried Tylenol for her headache without improvement.  She denies any stiff neck or neck pain, fever, tick bites or trauma.  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  She does report that occasionally she gets tingling in both legs but this goes away on its own.  Patient also reports that she was induced at 37 weeks for preeclampsia in her last pregnancy and has been prescribed Procardia XL to control her blood pressure; this morning, she was unable to keep her medication down due to vomiting.  Patient denies any lower abdominal pain, vaginal bleeding, or gush of fluid.  No dysuria.  Past Medical History:  Diagnosis Date  . Hypertension     Patient Active Problem List   Diagnosis Date Noted  . Indication for care in labor or delivery 05/24/2016    History reviewed. No pertinent surgical history.  Current Outpatient Rx  . Order  #: [id number] Class: Historical Med  . Order #: [id number] Class: Historical Med    Allergies Patient has no known allergies.  No family history on file.  Social History Social History   Tobacco Use  . Smoking status: Never Smoker  . Smokeless tobacco: Never Used  Substance Use Topics  . Alcohol use: No  . Drug use: Not on file    Review of Systems Constitutional: No fever/chills.  No lightheadedness or syncope. Eyes: No visual changes.  Positive photosensitivity.  No blurred or double vision. ENT: No sore throat. No congestion or rhinorrhea. Cardiovascular: Denies chest pain. Denies palpitations. Respiratory: Denies shortness of breath.  No cough. Gastrointestinal: No abdominal pain.  Positive nausea, positive vomiting.  No diarrhea.  No constipation. Genitourinary: Negative for dysuria.  No vaginal bleeding.  No gush of fluid.  No change in vaginal discharge. Musculoskeletal: Negative for back pain. Skin: Negative for rash. Neurological: Positive for chronic frontal headache during this pregnancy. No focal numbness.  Intermittent bilateral lower extremity tingling, not at this time.  Bilateral lower extremity weakness.    ____________________________________________   PHYSICAL EXAM:  VITAL SIGNS: ED Triage Vitals  Enc Vitals Group     BP 12/05/17 0912 (!) 146/89     Pulse Rate 12/05/17 0912 (!) 103     Resp 12/05/17 0912 18     Temp 12/05/17 0912 98.3 F (36.8 C)     Temp Source 12/05/17 0912 Oral     SpO2 12/05/17 0912 99 %  Weight 12/05/17 0913 150 lb (68 kg)     Height 12/05/17 0913 5\' 3"  (1.6 m)     Head Circumference --      Peak Flow --      Pain Score 12/05/17 0913 8     Pain Loc --      Pain Edu? --      Excl. in GC? --     Constitutional: Alert and oriented. Answers questions appropriately. Eyes: Conjunctivae are normal.  EOMI. PERRLA.  No vertical or horizontal nystagmus.  No scleral icterus. Head: Atraumatic. Nose: No  congestion/rhinnorhea. Mouth/Throat: Mucous membranes are mildly dry.  Neck: No stridor.  Supple.  No JVD.  No meningismus. Cardiovascular: Normal rate, regular rhythm. No murmurs, rubs or gallops.  Respiratory: Normal respiratory effort.  No accessory muscle use or retractions. Lungs CTAB.  No wheezes, rales or ronchi. Gastrointestinal: Soft, nontender and nondistended.  No guarding or rebound.  No peritoneal signs. Musculoskeletal: No LE edema. No ttp in the calves or palpable cords.  Negative Homan's sign. Neurologic: Alert and oriented 3. Speech is clear.  Face and smile symmetric. Tongue is midline.  EOMI.  PERRLA.  Mild photosensitivity.  No horizontal or vertical nystagmus.  No pronator drift. 5 out of 5 grip, biceps, triceps, hip flexors, plantar flexion and dorsiflexion. Normal sensation to light touch in the bilateral upper and lower extremities, and face. Normal heel-to-shin. Skin:  Skin is warm, dry and intact. No rash noted. Psychiatric: Depressed mood and affect.  ____________________________________________   LABS (all labs ordered are listed, but only abnormal results are displayed)  Labs Reviewed  CBC - Abnormal; Notable for the following components:      Result Value   Hemoglobin 11.1 (*)    HCT 32.9 (*)    MCV 76.5 (*)    MCH 25.8 (*)    All other components within normal limits  COMPREHENSIVE METABOLIC PANEL - Abnormal; Notable for the following components:   Potassium 3.2 (*)    Glucose, Bld 103 (*)    All other components within normal limits  URINALYSIS, COMPLETE (UACMP) WITH MICROSCOPIC   ____________________________________________  EKG  ED ECG REPORT I, Anne-Caroline Sharma Covert, the attending physician, personally viewed and interpreted this ECG.   Date: 12/05/2017  EKG Time: 1045  Rate: 88  Rhythm: normal sinus rhythm  Axis: normal  Intervals:none  ST&T Change: No STEMI  ____________________________________________  RADIOLOGY  Mr Mrv Head Wo  Cm  Result Date: 12/05/2017 CLINICAL DATA:  Bilateral leg weakness. EXAM: MR VENOGRAM HEAD WITHOUT CONTRAST TECHNIQUE: Angiographic images of the intracranial venous structures were obtained using MRV technique without intravenous contrast. COMPARISON:  None. FINDINGS: On the scout sequence there is no hydrocephalus, shift, or other asymmetry. Non-contrast MRV shows dominant right transverse sigmoid system. There is no filling defect within the major dural venous sinuses or paired deep veins to suggest thrombosis. 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 IMPRESSION: Negative non-contrast MRV. Electronically Signed   By: Marnee Spring M.D.   On: 12/05/2017 13:51    ____________________________________________   PROCEDURES  Procedure(s) performed: None  Procedures  Critical Care performed: No ____________________________________________   INITIAL IMPRESSION / ASSESSMENT AND PLAN / ED COURSE  Pertinent labs & imaging results that were available during my care of the patient were reviewed by me and considered in my medical decision making (see chart for details).  22 y.o. female [redacted] weeks pregnant  with twins with a history of preeclampsia in her last pregnancy and hyperemesis gravidarum in this pregnancy presenting for headache, nausea and vomiting.  Overall, the patient is mildly hypertensive in the 140s over 90s and on my examination has a normal heart rate.  She is afebrile.  The patient is describing a frontal headache which has been ongoing for this pregnancy but has been worse over the last couple of weeks.  Her symptoms are consistent with an acute on chronic headache.  Venous sinus thrombosis is considered given her pregnant state, but this is less likely.  Her neurologic examination does not show any acute findings and she does have 5 out of 5 lower extremity strength.  We will check  for electrolyte abnormality especially potassium deficiency given her vomiting.  This could cause bilateral lower leg symptoms.  We will also check for abnormal blood sugar, sodium abnormality, UTI.  I do a bedside ultrasound to evaluate fetal heart rates.  The patient will receive intravenous fluids and Tylenol for her symptoms.  I have talked to the patient about the possibility of MRI to evaluate for venous sinus thrombosis; we will see if she is responsive to therapeutic treatment for final decision; emergent imaging is not indicated at this time.  ----------------------------------------- 12:42 PM on 12/05/2017 -----------------------------------------  Likely, the patient continues to remain stable; she was treated for her hypertension with her home Procardia.  She reports that she has not had any further episodes of vomiting here but continues to have some spitting so we will re-dose her Zofran.  She will also receive an additional liter of intravenous fluids.  She reports that her headache is completely unchanged after fluids, nausea improvement and Tylenol and states that her right leg has fallen asleep.  Clinically, she is able to move her right leg.  I have ordered an MRV to evaluate for venous sinus thrombosis.    Also performed a bedside ultrasound which shows 2 fetuses, both of which are moving and have heart rates in the 130s and 140s.    Plan reevaluation for final disposition.  ----------------------------------------- 2:14 PM on 12/05/2017 -----------------------------------------  The patient's MRV does not show any evidence of venous sinus thrombosis; I have discussed this patient's imaging with the radiologist as it is noncontrasted scan and he feels it is adequate to rule out venous sinus thrombosis.  I reevaluated the patient who continues to be hemodynamically stable and not have any vomiting while in the emergency department.  I have reviewed her reassuring findings with  her, and I will make a phone call to the [facility] department to let them know that she was here in the results of our tests.  At this time, the patient is safe for discharge home.  I have encouraged her to take the vitamin B6 that has been recommended to her; she says that her insurance did not cover her directly just and ranitidine prescriptions.  Follow-up instructions as well as return precautions were discussed.  She already has a follow-up appointment with Dr. Sandi Carne next week. ____________________________________________  FINAL CLINICAL IMPRESSION(S) / ED DIAGNOSES  Final diagnoses:  Hypokalemia  Acute intractable headache, unspecified headache type  Weakness of both lower extremities  Hyperemesis gravidarum         NEW MEDICATIONS STARTED DURING THIS VISIT:  New Prescriptions   No medications on file      Rockne Menghini, MD 12/05/17 1247    Rockne Menghini, MD 12/05/17 1415

## 2017-12-05 NOTE — Discharge Instructions (Addendum)
Please take a clear liquid diet for the next 12 hours, then advance to a bland diet as tolerated.  You may take vitamin B6 as recommended by your OB/GYN.  Return to the emergency department for severe pain, lightheadedness or fainting, numbness tingling or weakness, or any other symptoms concerning to you.

## 2017-12-05 NOTE — ED Triage Notes (Signed)
Pt states severe headache since Monday, causing tingling and pain in her legs this am, second pregnancy, this time [redacted] weeks pregnant with twins.

## 2017-12-05 NOTE — ED Notes (Signed)
Patient returned from MRI. Complaining of continued headache and lightheadedness after getting pain medication. Also reports continued numbness in leg.

## 2017-12-05 NOTE — ED Notes (Signed)
Patient transported to MRI 

## 2018-01-06 ENCOUNTER — Other Ambulatory Visit: Payer: Self-pay

## 2018-01-06 ENCOUNTER — Emergency Department
Admission: EM | Admit: 2018-01-06 | Discharge: 2018-01-06 | Disposition: A | Discharge status: Home / Self Care | Payer: Medicaid Other | Attending: Emergency Medicine | Admitting: Emergency Medicine

## 2018-01-06 ENCOUNTER — Emergency Department: Payer: Medicaid Other

## 2018-01-06 DIAGNOSIS — Z79899 Other long term (current) drug therapy: Secondary | ICD-10-CM | POA: Insufficient documentation

## 2018-01-06 DIAGNOSIS — O30009 Twin pregnancy, unspecified number of placenta and unspecified number of amniotic sacs, unspecified trimester: Secondary | ICD-10-CM

## 2018-01-06 DIAGNOSIS — N898 Other specified noninflammatory disorders of vagina: Secondary | ICD-10-CM

## 2018-01-06 DIAGNOSIS — I10 Essential (primary) hypertension: Secondary | ICD-10-CM | POA: Insufficient documentation

## 2018-01-06 DIAGNOSIS — N390 Urinary tract infection, site not specified: Secondary | ICD-10-CM | POA: Insufficient documentation

## 2018-01-06 LAB — URINALYSIS, COMPLETE (UACMP) WITH MICROSCOPIC
Bilirubin Urine: NEGATIVE
GLUCOSE, UA: NEGATIVE mg/dL
HGB URINE DIPSTICK: NEGATIVE
Ketones, ur: 5 mg/dL — AB
Nitrite: NEGATIVE
PH: 8 (ref 5.0–8.0)
Protein, ur: NEGATIVE mg/dL
Specific Gravity, Urine: 1.011 (ref 1.005–1.030)

## 2018-01-06 LAB — WET PREP, GENITAL
CLUE CELLS WET PREP: NONE SEEN
SPERM: NONE SEEN
Trich, Wet Prep: NONE SEEN
Yeast Wet Prep HPF POC: NONE SEEN

## 2018-01-06 LAB — CHLAMYDIA/NGC RT PCR (ARMC ONLY)
CHLAMYDIA TR: NOT DETECTED
N GONORRHOEAE: NOT DETECTED

## 2018-01-06 MED ORDER — CEPHALEXIN 500 MG PO CAPS: 500.0000 mg | ORAL_CAPSULE | Freq: Two times a day (BID) | ORAL | 0 refills | Status: AC

## 2018-01-06 MED ORDER — CEPHALEXIN 500 MG PO CAPS
500.0000 mg | ORAL_CAPSULE | Freq: Once | ORAL | Status: AC
  Administered 2018-01-06: 500 mg via ORAL
  Filled 2018-01-06: qty 1

## 2018-01-06 NOTE — ED Provider Notes (Signed)
[facility] Emergency Department Provider Note ____________________________________________   First MD Initiated Contact with Patient 01/06/18 1541     (approximate)  I have reviewed the triage vital signs and the nursing notes.   HISTORY  Chief Complaint No chief complaint on file.    HPI Lorenna Hydia Rabago is a 22 y.o. female with PMH as noted below who presents with vaginal discharge, acute onset last night, and described as what she believes is a mucous plug.  The patient states that she was in the shower and a relatively large clump of mucus material came out.  The patient also reports some dysuria and vaginal discharge recently.  She reports some mild pelvic pressure but no pain.  She denies any vaginal bleeding, contractions, vomiting, fever, or other acute symptoms.  Past Medical History:  Diagnosis Date  . Hypertension     Patient Active Problem List   Diagnosis Date Noted  . Indication for care in labor or delivery 05/24/2016    History reviewed. No pertinent surgical history.  Prior to Admission medications   Medication Sig Start Date End Date Taking? Authorizing Provider  cephALEXin (KEFLEX) 500 MG capsule Take 1 capsule (500 mg total) by mouth 2 (two) times daily for 7 days. 01/07/18 01/14/18  Dionne BucySiadecki, Geraldo Haris, MD  IRON PO Take 1 tablet by mouth daily.    [provider]  Prenatal Vit-Fe Fumarate-FA (PRENATAL MULTIVITAMIN) TABS tablet Take 1 tablet by mouth daily at 12 noon.    [provider]    Allergies Patient has no known allergies.  History reviewed. No pertinent family history.  Social History Social History   Tobacco Use  . Smoking status: Never Smoker  . Smokeless tobacco: Never Used  Substance Use Topics  . Alcohol use: No  . Drug use: Not on file    Review of Systems  Constitutional: No fever. Eyes: No redness. ENT: No sore throat. Cardiovascular: Denies chest pain. Respiratory: Denies  shortness of breath. Gastrointestinal: No vomiting. Genitourinary: Positive for dysuria.  Positive for vaginal discharge. Musculoskeletal: Negative for back pain. Skin: Negative for rash. Neurological: Negative for headache.   ____________________________________________   PHYSICAL EXAM:  VITAL SIGNS: ED Triage Vitals  Enc Vitals Group     BP 01/06/18 1250 135/85     Pulse Rate 01/06/18 1250 97     Resp 01/06/18 1250 14     Temp 01/06/18 1250 98.3 F (36.8 C)     Temp Source 01/06/18 1250 Oral     SpO2 01/06/18 1250 97 %     Weight 01/06/18 1252 163 lb (73.9 kg)     Height 01/06/18 1252 5\' 2"  (1.575 m)     Head Circumference --      Peak Flow --      Pain Score 01/06/18 1252 5     Pain Loc --      Pain Edu? --      Excl. in GC? --     Constitutional: Alert and oriented.  Relatively well appearing and in no acute distress. Eyes: Conjunctivae are normal.  Head: Atraumatic. Nose: No congestion/rhinnorhea. Mouth/Throat: Mucous membranes are moist.   Neck: Normal range of motion.  Cardiovascular: Good peripheral circulation. Respiratory: Normal respiratory effort. Gastrointestinal: Soft and nontender. No distention.  Genitourinary: Normal external genitalia.  Yellow-white vaginal discharge.  No CMT or adnexal tenderness. Musculoskeletal:  Extremities warm and well perfused.  Neurologic:  Normal speech and language. No gross focal neurologic deficits are appreciated.  Skin:  Skin is warm and dry. No rash noted. Psychiatric: Mood and affect are normal. Speech and behavior are normal.  ____________________________________________   LABS (all labs ordered are listed, but only abnormal results are displayed)  Labs Reviewed  WET PREP, GENITAL - Abnormal; Notable for the following components:      Result Value   WBC, Wet Prep HPF POC MANY (*)    All other components within normal limits  URINALYSIS, COMPLETE (UACMP) WITH MICROSCOPIC - Abnormal; Notable for the following  components:   Color, Urine YELLOW (*)    APPearance CLOUDY (*)    Ketones, ur 5 (*)    Leukocytes, UA LARGE (*)    Bacteria, UA FEW (*)    All other components within normal limits  CHLAMYDIA/NGC RT PCR ([facility] ONLY)   ____________________________________________  EKG   ____________________________________________  RADIOLOGY  US pelvis: Twin pregnancy, approximately 17 weeks with +FHR and normal amniotic fluid  ____________________________________________   PROCEDURES  Procedure(s) performed: No  Procedures  Critical Care performed: No ____________________________________________   INITIAL IMPRESSION / ASSESSMENT AND PLAN / ED COURSE  Pertinent labs & imaging results that were available during my care of the patient were reviewed by me and considered in my medical decision making (see chart for details).  22 year old female at 59 weeks presents with vaginal discharge.  She is concerned that she passed a mucous plug last night although she has continued to have some discharge and dysuria.  She has no bleeding, pain, contractions, or other acute symptoms.  On exam the patient is well-appearing and her vital signs are normal.  The abdomen is soft with no focal tenderness.  On pelvic exam she has a moderate amount of yellowish discharge with no CMT or adnexal tenderness.  There is no clinical evidence of preterm labor and no indication for fetal monitoring.  Ultrasound confirmed twin pregnancy with +FHR normal amount of amniotic fluid.  Given the presence of continued discharge on exam, the object that the patient passed (which she showed a picture of (appears to be just a clump of discharge.  I sent wet prep and GC/CT swab as well as a UA.  ----------------------------------------- 6:51 PM on 01/06/2018 -----------------------------------------  The GC/CT probe and wet prep are negative except for WBCs.  UA shows leukocytes, bacteria, and significant WBCs.  The  patient did endorse some dysuria.  I will treat her for a UTI.  The etiology of the discharge is unclear, but it may be physiologic given the otherwise negative work-up.  I counseled the patient on the results of the work-up.  She feels comfortable going home.  She continues to have no bleeding, contractions, or other new symptoms.  There is still no indication for fetal monitoring.  I gave the patient thorough return precautions and she expressed understanding. ____________________________________________   FINAL CLINICAL IMPRESSION(S) / ED DIAGNOSES  Final diagnoses:  Urinary tract infection without hematuria, site unspecified  Vaginal discharge      NEW MEDICATIONS STARTED DURING THIS VISIT:  New Prescriptions   CEPHALEXIN (KEFLEX) 500 MG CAPSULE    Take 1 capsule (500 mg total) by mouth 2 (two) times daily for 7 days.     Note:  This document was prepared using Dragon voice recognition software and may include unintentional dictation errors.   Dionne Bucy, MD 01/06/18 [phone]

## 2018-01-06 NOTE — ED Notes (Signed)
Per Dr. Paduchowski no bloodwork at this time.  

## 2018-01-06 NOTE — Discharge Instructions (Addendum)
Take the antibiotic as prescribed and finish the full course.  You should follow-[facility] with your [facility] within the next 1 to 2 weeks to recheck your exam and make sure that the discharge is improving.  Return to the ER for new, worsening, persistent severe discharge, mucus, bleeding, pain, vomiting, or any other new or worsening symptoms that concern you.

## 2018-01-06 NOTE — ED Notes (Signed)
Pt given peanut butter, graham crackers and water

## 2018-01-06 NOTE — ED Triage Notes (Signed)
[redacted] weeks pregnant. States she believes that mucus plug came out last night. Denies vaginal bleeding or contractions. States feels low abd and vaginal pressure. 2nd pregnancy (twins), sees Dr. Oval Linseyaxfer in [place].   A&O, in wheelchair. States that she was taken to L&D and then brought back down to ED.

## 2018-01-06 NOTE — ED Notes (Signed)
Baby B (right side) HR 140  Baby A (left side) HR 112 (this RN believes this was baby A HR and not mom's HR. Heard Mom HR prior to finding baby A and moms didn't sound as fast to this RN while attempting to find baby A HR).

## 2018-02-16 DIAGNOSIS — M549 Dorsalgia, unspecified: Secondary | ICD-10-CM | POA: Insufficient documentation

## 2018-02-16 DIAGNOSIS — D508 Other iron deficiency anemias: Secondary | ICD-10-CM | POA: Insufficient documentation

## 2018-04-03 ENCOUNTER — Observation Stay
Admission: EM | Admit: 2018-04-03 | Discharge: 2018-04-03 | Disposition: A | Discharge status: Home / Self Care | Payer: Medicaid Other | Attending: Obstetrics and Gynecology | Admitting: Obstetrics and Gynecology

## 2018-04-03 ENCOUNTER — Other Ambulatory Visit: Payer: Self-pay

## 2018-04-03 DIAGNOSIS — O4703 False labor before 37 completed weeks of gestation, third trimester: Secondary | ICD-10-CM

## 2018-04-03 DIAGNOSIS — Z3A29 29 weeks gestation of pregnancy: Secondary | ICD-10-CM | POA: Diagnosis not present

## 2018-04-03 HISTORY — DX: Anemia, unspecified: D64.9

## 2018-04-03 LAB — URINALYSIS, COMPLETE (UACMP) WITH MICROSCOPIC
Bilirubin Urine: NEGATIVE
Glucose, UA: NEGATIVE mg/dL
Hgb urine dipstick: NEGATIVE
Ketones, ur: NEGATIVE mg/dL
NITRITE: NEGATIVE
PROTEIN: NEGATIVE mg/dL
SPECIFIC GRAVITY, URINE: 1.018 (ref 1.005–1.030)
pH: 6 (ref 5.0–8.0)

## 2018-04-03 MED ORDER — DIPHENHYDRAMINE HCL 50 MG/ML IJ SOLN
25.00 | INTRAMUSCULAR | Status: DC
Start: ? — End: 2018-04-03

## 2018-04-03 MED ORDER — SODIUM CHLORIDE 0.9 % IV SOLN
INTRAVENOUS | Status: DC
Start: ? — End: 2018-04-03

## 2018-04-03 MED ORDER — SIMETHICONE 80 MG PO CHEW
80.00 | CHEWABLE_TABLET | ORAL | Status: DC
Start: ? — End: 2018-04-03

## 2018-04-03 MED ORDER — GENERIC EXTERNAL MEDICATION
Status: DC
Start: ? — End: 2018-04-03

## 2018-04-03 MED ORDER — FERROUS SULFATE 325 (65 FE) MG PO TABS
325.00 | ORAL_TABLET | ORAL | Status: DC
Start: 2018-04-03 — End: 2018-04-03

## 2018-04-03 MED ORDER — GUAIFENESIN-DM 100-10 MG/5ML PO SYRP
5.00 | ORAL_SOLUTION | ORAL | Status: DC
Start: ? — End: 2018-04-03

## 2018-04-03 MED ORDER — MENTHOL 9.1 MG MT LOZG
1.00 | LOZENGE | OROMUCOSAL | Status: DC
Start: ? — End: 2018-04-03

## 2018-04-03 MED ORDER — NIFEDIPINE 10 MG PO CAPS
20.0000 mg | ORAL_CAPSULE | Freq: Once | ORAL | Status: AC
  Administered 2018-04-03: 20 mg via ORAL
  Filled 2018-04-03: qty 2

## 2018-04-03 MED ORDER — FAMOTIDINE 20 MG PO TABS
20.00 | ORAL_TABLET | ORAL | Status: DC
Start: 2018-04-01 — End: 2018-04-03

## 2018-04-03 NOTE — Progress Notes (Addendum)
Pt discharge home. Pt given instructions to return with worsening pain or ctx 3-34min apart, LOF or questionable LOF, vaginal bleeding, decreased fetal movement, fever over 100.2, increasingly intense pressure. Pt informed how to palpate her belly during contractions. Pt verbalized understanding.

## 2018-04-03 NOTE — Discharge Summary (Signed)
    L&D OB Triage Note  SUBJECTIVE Jean Wise is a 23 y.o. G36P0101 female at [redacted]w[redacted]d, EDD Estimated Date of Delivery: 06/13/18 who presented to triage with complaints of contractions.  Denies fluid leakage bleeding.  Says cervix was 3cm in office.   OB History  Gravida Para Term Preterm AB Living  2 1 0 1 0 1  SAB TAB Ectopic Multiple Live Births  0 0 0 0 1    # Outcome Date GA Lbr Len/2nd Weight Sex Delivery Anes PTL Lv  2 Current           1 Preterm  [redacted]w[redacted]d   M Vag-Spont   LIV    Medications Prior to Admission  Medication Sig Dispense Refill Last Dose  . IRON PO Take 1 tablet by mouth daily.   Past Week at Unknown time  . Prenatal Vit-Fe Fumarate-FA (PRENATAL MULTIVITAMIN) TABS tablet Take 1 tablet by mouth daily at 12 noon.   Past Month at Unknown time     OBJECTIVE  Nursing Evaluation:   BP 139/81 (BP Location: Left Arm)   Pulse 95   Temp 98.5 F (36.9 C) (Oral)   Resp 18   Ht 5\' 2"  (1.575 m)   Wt 77.1 kg   LMP 09/12/2017 (Approximate)   BMI 31.09 kg/m    Findings:   Occ contractons. Resolved with po hydration and 20 mg procardia.  No cervical change in 3 hours.  NST was performed and has been reviewed by me.  NST INTERPRETATION: Category I  Mode: External Baseline Rate (A): 140 bpm(fht) Variability: Moderate, Minimal Accelerations: 15 x 15 Decelerations: None     Contraction Frequency (min): 4-10.5  ASSESSMENT Impression:  1.  Pregnancy:  G2P0101 at [redacted]w[redacted]d , EDD Estimated Date of Delivery: 06/13/18 2.  NST:  Category I  PLAN 1. Reassurance given 2. Discharge home with standard labor precautions given to return to L&D or call the office for problems. 3. Continue routine prenatal care.

## 2018-04-03 NOTE — OB Triage Note (Signed)
Pt is a 22y/o G2P1 at [redacted]w[redacted]d with c/o ctx q5-7 minutes. Pt states positive fetal movement. Pt denies LOF or vaginal bleeding. Pt states she is feeling lower abdominal pain and pressure. Pt states pressure occurs with ctx. Monitors applied and assessing. Initial FHT-A:  140. Initial FHT-B: 155

## 2018-04-16 DIAGNOSIS — Z1331 Encounter for screening for depression: Secondary | ICD-10-CM | POA: Insufficient documentation

## 2018-08-05 ENCOUNTER — Ambulatory Visit
Admission: EM | Admit: 2018-08-05 | Discharge: 2018-08-05 | Disposition: A | Discharge status: Home / Self Care | Payer: Medicaid Other | Attending: Family Medicine | Admitting: Family Medicine

## 2018-08-05 ENCOUNTER — Other Ambulatory Visit: Payer: Self-pay

## 2018-08-05 ENCOUNTER — Encounter: Payer: Self-pay | Admitting: Emergency Medicine

## 2018-08-05 DIAGNOSIS — M545 Low back pain, unspecified: Secondary | ICD-10-CM

## 2018-08-05 MED ORDER — MELOXICAM 15 MG PO TABS: 15.0000 mg | ORAL_TABLET | Freq: Every day | ORAL | 0 refills | Status: DC | PRN

## 2018-08-05 MED ORDER — TIZANIDINE HCL 4 MG PO TABS: 4.0000 mg | ORAL_TABLET | Freq: Three times a day (TID) | ORAL | 0 refills | Status: DC | PRN
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
## 2018-08-05 NOTE — Discharge Instructions (Signed)
Rest.  Heat.  Medications as directed.  Take care  Dr. Michayla Mcneil  

## 2018-08-16 DIAGNOSIS — Z9289 Personal history of other medical treatment: Secondary | ICD-10-CM | POA: Insufficient documentation

## 2018-08-16 DIAGNOSIS — F419 Anxiety disorder, unspecified: Secondary | ICD-10-CM | POA: Insufficient documentation

## 2018-10-09 ENCOUNTER — Other Ambulatory Visit: Payer: Self-pay

## 2018-10-09 ENCOUNTER — Encounter: Payer: Self-pay | Admitting: Emergency Medicine

## 2018-10-09 ENCOUNTER — Emergency Department
Admission: EM | Admit: 2018-10-09 | Discharge: 2018-10-09 | Disposition: A | Discharge status: Home / Self Care | Payer: Medicaid Other | Attending: Emergency Medicine | Admitting: Emergency Medicine

## 2018-10-09 ENCOUNTER — Emergency Department: Payer: Medicaid Other

## 2018-10-09 DIAGNOSIS — W19XXXA Unspecified fall, initial encounter: Secondary | ICD-10-CM

## 2018-10-09 DIAGNOSIS — Y929 Unspecified place or not applicable: Secondary | ICD-10-CM | POA: Insufficient documentation

## 2018-10-09 DIAGNOSIS — I10 Essential (primary) hypertension: Secondary | ICD-10-CM | POA: Diagnosis not present

## 2018-10-09 DIAGNOSIS — Y999 Unspecified external cause status: Secondary | ICD-10-CM | POA: Insufficient documentation

## 2018-10-09 DIAGNOSIS — W010XXA Fall on same level from slipping, tripping and stumbling without subsequent striking against object, initial encounter: Secondary | ICD-10-CM | POA: Insufficient documentation

## 2018-10-09 DIAGNOSIS — Y939 Activity, unspecified: Secondary | ICD-10-CM | POA: Diagnosis not present

## 2018-10-09 DIAGNOSIS — M79622 Pain in left upper arm: Secondary | ICD-10-CM | POA: Diagnosis not present

## 2018-10-09 DIAGNOSIS — M25562 Pain in left knee: Secondary | ICD-10-CM | POA: Insufficient documentation

## 2018-10-09 DIAGNOSIS — M7918 Myalgia, other site: Secondary | ICD-10-CM

## 2018-10-09 MED ORDER — NAPROXEN 500 MG PO TABS: 500.0000 mg | ORAL_TABLET | Freq: Two times a day (BID) | ORAL | Status: DC

## 2018-10-09 MED ORDER — ORPHENADRINE CITRATE ER 100 MG PO TB12: 100.0000 mg | ORAL_TABLET | Freq: Two times a day (BID) | ORAL | 0 refills | Status: DC

## 2018-10-09 NOTE — ED Triage Notes (Signed)
Says she slipped in some grease last night whent she ran to get something and fell onto left side.  Hit head on door, no loc.  Pain left arm and leg.

## 2018-10-09 NOTE — ED Provider Notes (Signed)
[facility] Emergency Department Provider Note   ____________________________________________   First MD Initiated Contact with Patient 10/09/18 1040     (approximate)  I have reviewed the triage vital signs and the nursing notes.   HISTORY  Chief Complaint Fall, Leg Pain, and Arm Pain    HPI Jean Wise is a 23 y.o. female patient complain the left upper arm and knee pain secondary to a fall yesterday.  Patient states she slipped on grease and fell on her left side yesterday.  Patient states she hit her head but denies LOC.  Patient stated mild headache this morning.  Patient headache relieved with ibuprofen.  Patient state pain in left leg increased with standing and ambulation.  Patient denies loss sensation or loss of function of upper or lower extremities.  Patient rates her pain as a 8/10.  Patient described pain as "achy".     Past Medical History:  Diagnosis Date  . Anemia   . Hypertension     Patient Active Problem List   Diagnosis Date Noted  . Labor and delivery, indication for care 04/03/2018  . Indication for care in labor or delivery 05/24/2016    History reviewed. No pertinent surgical history.  Prior to Admission medications   Medication Sig Start Date End Date Taking? Authorizing Provider  meloxicam (MOBIC) 15 MG tablet Take 1 tablet (15 mg total) by mouth daily as needed for pain. 08/05/18   Coral Spikes, DO  naproxen (NAPROSYN) 500 MG tablet Take 1 tablet (500 mg total) by mouth 2 (two) times daily with a meal. 10/09/18   Sable Feil, PA-C  NIFEdipine (PROCARDIA-XL/NIFEDICAL-XL) 30 MG 24 hr tablet Take 30 mg by mouth daily.    [provider]  orphenadrine (NORFLEX) 100 MG tablet Take 1 tablet (100 mg total) by mouth 2 (two) times daily. 10/09/18   Sable Feil, PA-C  tiZANidine (ZANAFLEX) 4 MG tablet Take 1 tablet (4 mg total) by mouth every 8 (eight) hours as needed for muscle spasms. 08/05/18   Coral Spikes, DO     Allergies Patient has no known allergies.  Family History  Problem Relation Age of Onset  . Hypertension Mother     Social History Social History   Tobacco Use  . Smoking status: Never Smoker  . Smokeless tobacco: Never Used  Substance Use Topics  . Alcohol use: No    Frequency: Never  . Drug use: Never    Review of Systems Constitutional: No fever/chills Eyes: No visual changes. ENT: No sore throat. Cardiovascular: Denies chest pain. Respiratory: Denies shortness of breath. Gastrointestinal: No abdominal pain.  No nausea, no vomiting.  No diarrhea.  No constipation. Genitourinary: Negative for dysuria. Musculoskeletal: Left upper arm and knee pain. Skin: Negative for rash. Neurological: Negative for headaches, focal weakness or numbness. Endocrine:  Hypertension. Hematological/Lymphatic:  Anemia.   ____________________________________________   PHYSICAL EXAM:  VITAL SIGNS: ED Triage Vitals  Enc Vitals Group     BP 10/09/18 1029 (!) 169/109     Pulse Rate 10/09/18 1029 81     Resp 10/09/18 1029 14     Temp 10/09/18 1029 98.6 F (37 C)     Temp Source 10/09/18 1029 Oral     SpO2 10/09/18 1029 100 %     Weight 10/09/18 1030 145 lb (65.8 kg)     Height 10/09/18 1030 5\' 2"  (1.575 m)     Head Circumference --      Peak  Flow --      Pain Score 10/09/18 1030 8     Pain Loc --      Pain Edu? --      Excl. in GC? --    Constitutional: Alert and oriented. Well appearing and in no acute distress. Eyes: Conjunctivae are normal. PERRL. EOMI. Head: Atraumatic. Nose: No congestion/rhinnorhea. Mouth/Throat: Mucous membranes are moist.  Oropharynx non-erythematous. Neck: No cervical spine tenderness to palpation. Cardiovascular: Normal rate, regular rhythm. Grossly normal heart sounds.  Good peripheral circulation. Respiratory: Normal respiratory effort.  No retractions. Lungs CTAB. Gastrointestinal: Soft and nontender. No distention. No abdominal bruits. No  CVA tenderness. Musculoskeletal: No lower extremity tenderness nor edema.  No joint effusions. Neurologic:  Normal speech and language. No gross focal neurologic deficits are appreciated. No gait instability. Skin:  Skin is warm, dry and intact. No rash noted. Psychiatric: Mood and affect are normal. Speech and behavior are normal.  ____________________________________________   LABS (all labs ordered are listed, but only abnormal results are displayed)  Labs Reviewed - No data to display ____________________________________________  EKG   ____________________________________________  RADIOLOGY  ED MD interpretation:    Official radiology report(s): Dg Knee 2 Views Left  Result Date: 10/09/2018 CLINICAL DATA:  Pain post fall. EXAM: LEFT KNEE - 1-2 VIEW COMPARISON:  None. FINDINGS: No evidence of fracture, dislocation, or joint effusion. No evidence of arthropathy or other focal bone abnormality. Soft tissues are unremarkable. IMPRESSION: Negative. Electronically Signed   By: Ted Mcalpine M.D.   On: 10/09/2018 11:26   Dg Humerus Left  Result Date: 10/09/2018 CLINICAL DATA:  Left humerus pain after falling today. Difficulty extending the arm. EXAM: LEFT HUMERUS - 2+ VIEW COMPARISON:  None. FINDINGS: The mineralization and alignment are normal. There is no evidence of acute fracture or dislocation. The joint spaces appear preserved at the shoulder and elbow. Range of motion appears adequate. No focal soft tissue abnormalities are identified. IMPRESSION: No evidence of acute injury of the left upper arm. Electronically Signed   By: Carey Bullocks M.D.   On: 10/09/2018 11:27    ____________________________________________   PROCEDURES  Procedure(s) performed (including Critical Care):  Procedures   ____________________________________________   INITIAL IMPRESSION / ASSESSMENT AND PLAN / ED COURSE  As part of my medical decision making, I reviewed the following data  within the electronic MEDICAL RECORD NUMBER  Jean Wise was evaluated in Emergency Department on 10/09/2018 for the symptoms described in the history of present illness. She was evaluated in the context of the global COVID-19 pandemic, which necessitated consideration that the patient might be at risk for infection with the SARS-CoV-2 virus that causes COVID-19. Institutional protocols and algorithms that pertain to the evaluation of patients at risk for COVID-19 are in a state of rapid change based on information released by regulatory bodies including the CDC and federal and state organizations. These policies and algorithms were followed during the patient's care in the ED.  Patient presents for left upper and lower extremity pain secondary to a slip and fall yesterday.  Discussed neck x-ray findings with patient.  Physical exam is consistent with musculoskeletal pain secondary to fall.  Patient given discharge care instruction work note.  Patient advised [facility] with [facility] condition persist.             ____________________________________________   FINAL CLINICAL IMPRESSION(S) / ED DIAGNOSES  Final diagnoses:  Fall, initial encounter  Musculoskeletal pain     ED Discharge Orders  Ordered    naproxen (NAPROSYN) 500 MG tablet  2 times daily with meals     10/09/18 1133    orphenadrine (NORFLEX) 100 MG tablet  2 times daily     10/09/18 1133           Note:  This document was prepared using Dragon voice recognition software and may include unintentional dictation errors.    Joni ReiningSmith, Ronald K, PA-C 10/09/18 1136    Emily FilbertWilliams, Jonathan E, MD 10/09/18 [phone]

## 2019-01-08 DIAGNOSIS — Z8616 Personal history of COVID-19: Secondary | ICD-10-CM | POA: Insufficient documentation

## 2019-06-12 DIAGNOSIS — K529 Noninfective gastroenteritis and colitis, unspecified: Secondary | ICD-10-CM | POA: Insufficient documentation

## 2019-10-10 ENCOUNTER — Emergency Department
Admission: EM | Admit: 2019-10-10 | Discharge: 2019-10-10 | Disposition: A | Discharge status: Home / Self Care | Payer: Medicaid Other | Attending: Emergency Medicine | Admitting: Emergency Medicine

## 2019-10-10 ENCOUNTER — Encounter: Payer: Self-pay | Admitting: Emergency Medicine

## 2019-10-10 ENCOUNTER — Other Ambulatory Visit: Payer: Self-pay

## 2019-10-10 DIAGNOSIS — R350 Frequency of micturition: Secondary | ICD-10-CM | POA: Insufficient documentation

## 2019-10-10 DIAGNOSIS — Z79899 Other long term (current) drug therapy: Secondary | ICD-10-CM | POA: Diagnosis not present

## 2019-10-10 DIAGNOSIS — R300 Dysuria: Secondary | ICD-10-CM

## 2019-10-10 DIAGNOSIS — I10 Essential (primary) hypertension: Secondary | ICD-10-CM | POA: Diagnosis not present

## 2019-10-10 LAB — URINALYSIS, COMPLETE (UACMP) WITH MICROSCOPIC
Bacteria, UA: NONE SEEN
Bilirubin Urine: NEGATIVE
Glucose, UA: NEGATIVE mg/dL
Hgb urine dipstick: NEGATIVE
Ketones, ur: NEGATIVE mg/dL
Nitrite: NEGATIVE
Protein, ur: NEGATIVE mg/dL
Specific Gravity, Urine: 1.02 (ref 1.005–1.030)
pH: 6 (ref 5.0–8.0)

## 2019-10-10 LAB — POCT PREGNANCY, URINE: Preg Test, Ur: NEGATIVE

## 2019-10-10 MED ORDER — PHENAZOPYRIDINE HCL 200 MG PO TABS: 200.0000 mg | ORAL_TABLET | Freq: Three times a day (TID) | ORAL | 0 refills | Status: DC | PRN

## 2019-10-10 MED ORDER — SULFAMETHOXAZOLE-TRIMETHOPRIM 800-160 MG PO TABS: 1.0000 | ORAL_TABLET | Freq: Two times a day (BID) | ORAL | 0 refills | Status: AC

## 2019-10-10 NOTE — Discharge Instructions (Signed)
Please follow-up with your primary care provider of your choice for symptoms that are not improving over the next few days.  Return to the emergency department for symptoms of change or worsen if you are unable to schedule appointment.

## 2019-10-10 NOTE — ED Provider Notes (Signed)
[facility] Emergency Department Provider Note  ____________________________________________  Time seen: Approximately 8:50 AM  I have reviewed the triage vital signs and the nursing notes.   HISTORY  Chief Complaint Dysuria    HPI Jean Wise is a 24 y.o. female who presents to the emergency department for treatment and evaluation of low back pain, dysuria, and urinary frequency.  Symptoms started yesterday.  Since having children, she has had a few urinary tract infections.  The symptoms are consistent with previous.  She denies fever.  No alleviating measures attempted prior to arrival.   Past Medical History:  Diagnosis Date  . Anemia   . Hypertension     Patient Active Problem List   Diagnosis Date Noted  . Labor and delivery, indication for care 04/03/2018  . Indication for care in labor or delivery 05/24/2016    History reviewed. No pertinent surgical history.  Prior to Admission medications   Medication Sig Start Date End Date Taking? Authorizing Provider  meloxicam (MOBIC) 15 MG tablet Take 1 tablet (15 mg total) by mouth daily as needed for pain. 08/05/18   Tommie Sams, DO  naproxen (NAPROSYN) 500 MG tablet Take 1 tablet (500 mg total) by mouth 2 (two) times daily with a meal. 10/09/18   Joni Reining, PA-C  NIFEdipine (PROCARDIA-XL/NIFEDICAL-XL) 30 MG 24 hr tablet Take 30 mg by mouth daily.    [provider]  orphenadrine (NORFLEX) 100 MG tablet Take 1 tablet (100 mg total) by mouth 2 (two) times daily. 10/09/18   Joni Reining, PA-C  phenazopyridine (PYRIDIUM) 200 MG tablet Take 1 tablet (200 mg total) by mouth 3 (three) times daily as needed for pain. 10/10/19   Aslan Montagna, Rulon Eisenmenger B, FNP  sulfamethoxazole-trimethoprim (BACTRIM DS) 800-160 MG tablet Take 1 tablet by mouth 2 (two) times daily for 3 days. 10/10/19 10/13/19  Rukaya Kleinschmidt, Kasandra Knudsen, FNP  tiZANidine (ZANAFLEX) 4 MG tablet Take 1 tablet (4 mg total) by mouth every 8 (eight)  hours as needed for muscle spasms. 08/05/18   Tommie Sams, DO    Allergies Patient has no known allergies.  Family History  Problem Relation Age of Onset  . Hypertension Mother     Social History Social History   Tobacco Use  . Smoking status: Never Smoker  . Smokeless tobacco: Never Used  Vaping Use  . Vaping Use: Never used  Substance Use Topics  . Alcohol use: No  . Drug use: Never    Review of Systems Constitutional: Negative for fever. Respiratory: Negative for shortness of breath or cough. Gastrointestinal: Negative for abdominal pain; negative for nausea , negative for vomiting. Genitourinary: Positive for dysuria , negative for vaginal discharge. Musculoskeletal: Positive for back pain. Skin: Negative for acute skin changes/rash/lesion. ____________________________________________   PHYSICAL EXAM:  VITAL SIGNS: ED Triage Vitals  Enc Vitals Group     BP 10/10/19 0753 (!) 135/94     Pulse Rate 10/10/19 0753 98     Resp 10/10/19 0753 16     Temp 10/10/19 0753 99.4 F (37.4 C)     Temp Source 10/10/19 0753 Oral     SpO2 10/10/19 0753 100 %     Weight 10/10/19 0739 140 lb (63.5 kg)     Height 10/10/19 0739 5\' 2"  (1.575 m)     Head Circumference --      Peak Flow --      Pain Score 10/10/19 0739 0     Pain Loc --  Pain Edu? --      Excl. in GC? --     Constitutional: Alert and oriented. Well appearing and in no acute distress. Eyes: Conjunctivae are normal. Head: Atraumatic. Nose: No congestion/rhinnorhea. Mouth/Throat: Mucous membranes are moist. Respiratory: Normal respiratory effort.  No retractions. Gastrointestinal: Bowel sounds active x 4; Abdomen is soft without rebound or guarding. Genitourinary: Pelvic exam: Not indicated Musculoskeletal: No extremity tenderness nor edema.  Neurologic:  Normal speech and language. No gross focal neurologic deficits are appreciated. Speech is normal. No gait instability. Skin:  Skin is warm, dry and  intact. No rash noted on exposed skin. Psychiatric: Mood and affect are normal. Speech and behavior are normal.  ____________________________________________   LABS (all labs ordered are listed, but only abnormal results are displayed)  Labs Reviewed  URINALYSIS, COMPLETE (UACMP) WITH MICROSCOPIC - Abnormal; Notable for the following components:      Result Value   Color, Urine YELLOW (*)    APPearance CLEAR (*)    Leukocytes,Ua SMALL (*)    All other components within normal limits  POCT PREGNANCY, URINE  POC URINE PREG, ED   ____________________________________________  RADIOLOGY  Not indicated ____________________________________________  Procedures  ____________________________________________  24 year old female presenting to the emergency department for treatment and evaluation of symptoms as described in the HPI.  Although urinalysis only shows small leukocytes 6-10 red blood cells but no bacteria or white blood cells, patient is symptomatic and feels that the symptoms are similar to previous confirmed urinary tract infections.  She will be treated with Pyridium and Bactrim.  Urine will be sent for culture.  She will be discharged home with instructions to follow-up with primary care or return to the emergency department for symptoms of change or worsen.  INITIAL IMPRESSION / ASSESSMENT AND PLAN / ED COURSE  Pertinent labs & imaging results that were available during my care of the patient were reviewed by me and considered in my medical decision making (see chart for details).  ____________________________________________   FINAL CLINICAL IMPRESSION(S) / ED DIAGNOSES  Final diagnoses:  Dysuria  Urinary frequency    Note:  This document was prepared using Dragon voice recognition software and may include unintentional dictation errors.   Chinita Pester, FNP 10/10/19 0859    Gilles Chiquito, MD 10/10/19 [phone]

## 2019-10-10 NOTE — ED Triage Notes (Signed)
Pt here for dysuria and lower back pain since yesterday.  Ambulatory, NAD feels like when gets UTI

## 2019-12-19 ENCOUNTER — Emergency Department
Admission: EM | Admit: 2019-12-19 | Discharge: 2019-12-19 | Disposition: A | Discharge status: Home / Self Care | Payer: Medicaid Other | Attending: Emergency Medicine | Admitting: Emergency Medicine

## 2019-12-19 ENCOUNTER — Other Ambulatory Visit: Payer: Self-pay

## 2019-12-19 DIAGNOSIS — I10 Essential (primary) hypertension: Secondary | ICD-10-CM | POA: Insufficient documentation

## 2019-12-19 DIAGNOSIS — Z113 Encounter for screening for infections with a predominantly sexual mode of transmission: Secondary | ICD-10-CM | POA: Diagnosis not present

## 2019-12-19 DIAGNOSIS — Z87440 Personal history of urinary (tract) infections: Secondary | ICD-10-CM | POA: Insufficient documentation

## 2019-12-19 DIAGNOSIS — R300 Dysuria: Secondary | ICD-10-CM | POA: Insufficient documentation

## 2019-12-19 DIAGNOSIS — Z79899 Other long term (current) drug therapy: Secondary | ICD-10-CM | POA: Diagnosis not present

## 2019-12-19 LAB — WET PREP, GENITAL
Sperm: NONE SEEN
Trich, Wet Prep: NONE SEEN

## 2019-12-19 LAB — URINALYSIS, COMPLETE (UACMP) WITH MICROSCOPIC
Bacteria, UA: NONE SEEN
Bilirubin Urine: NEGATIVE
Glucose, UA: NEGATIVE mg/dL
Hgb urine dipstick: NEGATIVE
Ketones, ur: NEGATIVE mg/dL
Leukocytes,Ua: NEGATIVE
Nitrite: NEGATIVE
Protein, ur: NEGATIVE mg/dL
Specific Gravity, Urine: 1.017 (ref 1.005–1.030)
pH: 7 (ref 5.0–8.0)

## 2019-12-19 MED ORDER — PHENAZOPYRIDINE HCL 100 MG PO TABS: 200.0000 mg | ORAL_TABLET | Freq: Three times a day (TID) | ORAL | 0 refills | Status: AC | PRN

## 2019-12-19 MED ORDER — PHENAZOPYRIDINE HCL 100 MG PO TABS
95.0000 mg | ORAL_TABLET | Freq: Once | ORAL | Status: AC
  Administered 2019-12-19: 100 mg via ORAL
  Filled 2019-12-19: qty 1

## 2019-12-19 NOTE — ED Triage Notes (Signed)
Pt states having recurrent UTIs. Pt states that she does not drink water, just soda.  Pt states burning with urination.

## 2019-12-20 LAB — CHLAMYDIA/NGC RT PCR (ARMC ONLY)
Chlamydia Tr: NOT DETECTED
N gonorrhoeae: NOT DETECTED

## 2019-12-21 ENCOUNTER — Telehealth: Payer: Self-pay | Admitting: Student

## 2019-12-21 ENCOUNTER — Encounter: Payer: Self-pay | Admitting: Student

## 2019-12-21 LAB — URINE CULTURE: Culture: NO GROWTH

## 2019-12-21 MED ORDER — METRONIDAZOLE 500 MG PO TABS: 500.0000 mg | ORAL_TABLET | Freq: Two times a day (BID) | ORAL | 0 refills | Status: AC

## 2019-12-21 MED ORDER — FLUCONAZOLE 150 MG PO TABS: ORAL_TABLET | ORAL | 0 refills | Status: AC

## 2019-12-21 NOTE — Telephone Encounter (Cosign Needed)
Patient was called regarding results from wet prep in the ER.  Patient is positive for yeast and BV, medications sent to [facility] in [place].

## 2019-12-21 NOTE — ED Provider Notes (Signed)
[facility] Emergency Department Provider Note  ___________________________________________   First MD Initiated Contact with Patient 12/19/19 2208     (approximate)  I have reviewed the triage vital signs and the nursing notes.   HISTORY  Chief Complaint Recurrent UTI   HPI Jean Wise is a 24 y.o. female who presents to the emergency department with complaint of feeling like she has a repeat UTI.  She states that she has had dysuria intermittently for about the last week.  She states that she has been getting these frequently since having her twins approximately a year ago, and has noticed these particularly occur just after periods.  She is not having any nausea vomiting, diarrhea abdominal pain or fever.  She denies any other systemic symptoms.         Past Medical History:  Diagnosis Date  . Anemia   . Hypertension     Patient Active Problem List   Diagnosis Date Noted  . Labor and delivery, indication for care 04/03/2018  . Indication for care in labor or delivery 05/24/2016    No past surgical history on file.  Prior to Admission medications   Medication Sig Start Date End Date Taking? Authorizing Provider  meloxicam (MOBIC) 15 MG tablet Take 1 tablet (15 mg total) by mouth daily as needed for pain. 08/05/18   Tommie Sams, DO  naproxen (NAPROSYN) 500 MG tablet Take 1 tablet (500 mg total) by mouth 2 (two) times daily with a meal. 10/09/18   Joni Reining, PA-C  NIFEdipine (PROCARDIA-XL/NIFEDICAL-XL) 30 MG 24 hr tablet Take 30 mg by mouth daily.    [provider]  orphenadrine (NORFLEX) 100 MG tablet Take 1 tablet (100 mg total) by mouth 2 (two) times daily. 10/09/18   Joni Reining, PA-C  phenazopyridine (PYRIDIUM) 100 MG tablet Take 2 tablets (200 mg total) by mouth 3 (three) times daily as needed for up to 3 days for pain. 12/19/19 12/22/19  Lucy Chris, PA  tiZANidine (ZANAFLEX) 4 MG tablet Take 1 tablet (4 mg  total) by mouth every 8 (eight) hours as needed for muscle spasms. 08/05/18   Tommie Sams, DO    Allergies Patient has no known allergies.  Family History  Problem Relation Age of Onset  . Hypertension Mother     Social History Social History   Tobacco Use  . Smoking status: Never Smoker  . Smokeless tobacco: Never Used  Vaping Use  . Vaping Use: Never used  Substance Use Topics  . Alcohol use: No  . Drug use: Never    Review of Systems Constitutional: No fever/chills Eyes: No visual changes. ENT: No sore throat. Cardiovascular: Denies chest pain. Respiratory: Denies shortness of breath. Gastrointestinal: No abdominal pain.  No nausea, no vomiting.  No diarrhea.  No constipation. Genitourinary: + dysuria. Musculoskeletal: Negative for back pain. Skin: Negative for rash. Neurological: Negative for headaches, focal weakness or numbness.   ____________________________________________   PHYSICAL EXAM:  VITAL SIGNS: ED Triage Vitals  Enc Vitals Group     BP 12/19/19 2117 (!) 190/122     Pulse Rate 12/19/19 2117 98     Resp 12/19/19 2117 18     Temp 12/19/19 2117 98.2 F (36.8 C)     Temp src --      SpO2 12/19/19 2117 100 %     Weight 12/19/19 2118 150 lb (68 kg)     Height --      Head  Circumference --      Peak Flow --      Pain Score 12/19/19 2118 0     Pain Loc --      Pain Edu? --      Excl. in GC? --     Constitutional: Alert and oriented. Well appearing and in no acute distress. Eyes: Conjunctivae are normal.  EOMI. Head: Atraumatic. Nose: No congestion/rhinnorhea. Mouth/Throat: Mucous membranes are moist.   Cardiovascular: Normal rate, regular rhythm. Grossly normal heart sounds.  Good peripheral circulation. Respiratory: Normal respiratory effort.  No retractions. Lungs CTAB. Gastrointestinal: Soft and nontender. No distention. No abdominal bruits. No CVA tenderness. Musculoskeletal: No lower extremity tenderness nor edema.  No joint  effusions. Neurologic:  Normal speech and language. No gross focal neurologic deficits are appreciated. No gait instability. Skin:  Skin is warm, dry and intact. No rash noted. Psychiatric: Mood and affect are normal. Speech and behavior are normal.  ____________________________________________   LABS (all labs ordered are listed, but only abnormal results are displayed)  Labs Reviewed  WET PREP, GENITAL - Abnormal; Notable for the following components:      Result Value   Yeast Wet Prep HPF POC PRESENT (*)    Clue Cells Wet Prep HPF POC PRESENT (*)    WBC, Wet Prep HPF POC MODERATE (*)    All other components within normal limits  URINALYSIS, COMPLETE (UACMP) WITH MICROSCOPIC - Abnormal; Notable for the following components:   Color, Urine YELLOW (*)    APPearance CLEAR (*)    All other components within normal limits  CHLAMYDIA/NGC RT PCR ([facility] ONLY)  URINE CULTURE    ____________________________________________   INITIAL IMPRESSION / ASSESSMENT AND PLAN / ED COURSE  As part of my medical decision making, I reviewed the following data within the electronic MEDICAL RECORD NUMBER Nursing notes reviewed and incorporated, Labs reviewed and Notes from prior ED visits        Patient is a 24 year old female who presents to department today for concern of possible UTI given dysuria over the last week.  She was seen at the emergency department in September for similar complaint.  At that time, she did have a small number of leukocytes in her urine and was treated with antibiotics.  She states that her symptoms improved for quite some time until it returned after her most recent menstrual period.  Overall, she has a normal physical exam with no abdominal tenderness, no CVA tenderness, vital signs show that she is afebrile.  Urinalysis is clear today with no leukocytes, nitrates, white cells or bacteria.  Discussed this with the patient as well as alternate things that can cause  dysuria such as STDs, yeast, BV or other vaginitis.  The patient would like to proceed with swabs for this, but is amenable with leaving the ER and I will call her with the results.  She understands that she will have to return to the emergency department if she is positive for an STD that requires antibiotic injection.  In the interim, we will treat her symptoms with Pyridium for symptomatic treatment.  Patient is amenable with this plan and will follow up with OB/GYN given her frequency of symptoms.      ____________________________________________   FINAL CLINICAL IMPRESSION(S) / ED DIAGNOSES  Final diagnoses:  Dysuria  Screen for STD (sexually transmitted disease)     ED Discharge Orders         Ordered    phenazopyridine (PYRIDIUM) 100 MG tablet  3 times daily PRN        12/19/19 2250          *Please note:  Jean Wise was evaluated in Emergency Department on 12/21/2019 for the symptoms described in the history of present illness. She was evaluated in the context of the global COVID-19 pandemic, which necessitated consideration that the patient might be at risk for infection with the SARS-CoV-2 virus that causes COVID-19. Institutional protocols and algorithms that pertain to the evaluation of patients at risk for COVID-19 are in a state of rapid change based on information released by regulatory bodies including the CDC and federal and state organizations. These policies and algorithms were followed during the patient's care in the ED.  Some ED evaluations and interventions may be delayed as a result of limited staffing during and the pandemic.*   Note:  This document was prepared using Dragon voice recognition software and may include unintentional dictation errors.    Lucy Chris, PA 12/21/19 4193    Delton Prairie, MD 12/25/19 [phone]

## 2020-05-06 ENCOUNTER — Emergency Department
Admission: EM | Admit: 2020-05-06 | Discharge: 2020-05-06 | Disposition: A | Discharge status: Home / Self Care | Payer: Medicaid Other | Attending: Emergency Medicine | Admitting: Emergency Medicine

## 2020-05-06 ENCOUNTER — Other Ambulatory Visit: Payer: Self-pay

## 2020-05-06 ENCOUNTER — Encounter: Payer: Self-pay | Admitting: Emergency Medicine

## 2020-05-06 ENCOUNTER — Emergency Department: Payer: Medicaid Other

## 2020-05-06 DIAGNOSIS — R7989 Other specified abnormal findings of blood chemistry: Secondary | ICD-10-CM

## 2020-05-06 DIAGNOSIS — R3129 Other microscopic hematuria: Secondary | ICD-10-CM | POA: Insufficient documentation

## 2020-05-06 DIAGNOSIS — I10 Essential (primary) hypertension: Secondary | ICD-10-CM | POA: Diagnosis not present

## 2020-05-06 DIAGNOSIS — R197 Diarrhea, unspecified: Secondary | ICD-10-CM | POA: Insufficient documentation

## 2020-05-06 DIAGNOSIS — R945 Abnormal results of liver function studies: Secondary | ICD-10-CM | POA: Insufficient documentation

## 2020-05-06 DIAGNOSIS — R112 Nausea with vomiting, unspecified: Secondary | ICD-10-CM | POA: Diagnosis present

## 2020-05-06 DIAGNOSIS — Z79899 Other long term (current) drug therapy: Secondary | ICD-10-CM | POA: Insufficient documentation

## 2020-05-06 LAB — CBC
HCT: 35.5 % — ABNORMAL LOW (ref 36.0–46.0)
Hemoglobin: 11.3 g/dL — ABNORMAL LOW (ref 12.0–15.0)
MCH: 22.5 pg — ABNORMAL LOW (ref 26.0–34.0)
MCHC: 31.8 g/dL (ref 30.0–36.0)
MCV: 70.6 fL — ABNORMAL LOW (ref 80.0–100.0)
Platelets: 248 10*3/uL (ref 150–400)
RBC: 5.03 MIL/uL (ref 3.87–5.11)
RDW: 14.8 % (ref 11.5–15.5)
WBC: 7.9 10*3/uL (ref 4.0–10.5)
nRBC: 0 % (ref 0.0–0.2)

## 2020-05-06 LAB — URINALYSIS, COMPLETE (UACMP) WITH MICROSCOPIC
Bilirubin Urine: NEGATIVE
Glucose, UA: NEGATIVE mg/dL
Ketones, ur: 80 mg/dL — AB
Leukocytes,Ua: NEGATIVE
Nitrite: NEGATIVE
Protein, ur: 30 mg/dL — AB
RBC / HPF: >50 RBC/hpf — ABNORMAL HIGH (ref 0–5)
Specific Gravity, Urine: 1.017 (ref 1.005–1.030)
pH: 6 (ref 5.0–8.0)

## 2020-05-06 LAB — COMPREHENSIVE METABOLIC PANEL
ALT: 91 U/L — ABNORMAL HIGH (ref 0–44)
AST: 196 U/L — ABNORMAL HIGH (ref 15–41)
Albumin: 4.1 g/dL (ref 3.5–5.0)
Alkaline Phosphatase: 106 U/L (ref 38–126)
Anion gap: 10 (ref 5–15)
BUN: 18 mg/dL (ref 6–20)
CO2: 21 mmol/L — ABNORMAL LOW (ref 22–32)
Calcium: 8.8 mg/dL — ABNORMAL LOW (ref 8.9–10.3)
Chloride: 105 mmol/L (ref 98–111)
Creatinine, Ser: 0.76 mg/dL (ref 0.44–1.00)
GFR, Estimated: >60 mL/min (ref >60)
Glucose, Bld: 121 mg/dL — ABNORMAL HIGH (ref 70–99)
Potassium: 3.4 mmol/L — ABNORMAL LOW (ref 3.5–5.1)
Sodium: 136 mmol/L (ref 135–145)
Total Bilirubin: 0.7 mg/dL (ref 0.3–1.2)
Total Protein: 8.1 g/dL (ref 6.5–8.1)

## 2020-05-06 LAB — POC URINE PREG, ED: Preg Test, Ur: NEGATIVE

## 2020-05-06 LAB — LIPASE, BLOOD: Lipase: 26 U/L (ref 11–51)

## 2020-05-06 MED ORDER — ACETAMINOPHEN 325 MG PO TABS
650.0000 mg | ORAL_TABLET | Freq: Once | ORAL | Status: AC
  Administered 2020-05-06: 650 mg via ORAL
  Filled 2020-05-06: qty 2

## 2020-05-06 MED ORDER — DICYCLOMINE HCL 20 MG PO TABS: 20.0000 mg | ORAL_TABLET | Freq: Three times a day (TID) | ORAL | 0 refills | Status: AC | PRN

## 2020-05-06 MED ORDER — SODIUM CHLORIDE 0.9 % IV BOLUS (SEPSIS)
1000.0000 mL | Freq: Once | INTRAVENOUS | Status: AC
  Administered 2020-05-06: 1000 mL via INTRAVENOUS

## 2020-05-06 MED ORDER — DICYCLOMINE HCL 10 MG PO CAPS
20.0000 mg | ORAL_CAPSULE | Freq: Once | ORAL | Status: AC
  Administered 2020-05-06: 20 mg via ORAL
  Filled 2020-05-06: qty 2

## 2020-05-06 MED ORDER — KETOROLAC TROMETHAMINE 30 MG/ML IJ SOLN
30.0000 mg | Freq: Once | INTRAMUSCULAR | Status: AC
  Administered 2020-05-06: 30 mg via INTRAVENOUS
  Filled 2020-05-06: qty 1

## 2020-05-06 MED ORDER — LOPERAMIDE HCL 2 MG PO CAPS
2.0000 mg | ORAL_CAPSULE | Freq: Once | ORAL | Status: AC
  Administered 2020-05-06: 2 mg via ORAL
  Filled 2020-05-06: qty 1

## 2020-05-06 MED ORDER — ONDANSETRON HCL 4 MG/2ML IJ SOLN
4.0000 mg | Freq: Once | INTRAMUSCULAR | Status: AC
  Administered 2020-05-06: 4 mg via INTRAVENOUS
  Filled 2020-05-06: qty 2

## 2020-05-06 MED ORDER — ONDANSETRON 4 MG PO TBDP
4.0000 mg | ORAL_TABLET | Freq: Once | ORAL | Status: AC
  Administered 2020-05-06: 4 mg via ORAL
  Filled 2020-05-06: qty 1

## 2020-05-06 MED ORDER — ONDANSETRON 4 MG PO TBDP: 4.0000 mg | ORAL_TABLET | Freq: Four times a day (QID) | ORAL | 0 refills | Status: DC | PRN

## 2020-05-06 NOTE — ED Provider Notes (Signed)
[facility] Emergency Department Provider Note  ____________________________________________   Event Date/Time   First MD Initiated Contact with Patient 05/06/20 0413     (approximate)  I have reviewed the triage vital signs and the nursing notes.   HISTORY  Chief Complaint Abdominal Pain    HPI Jean Wise is a 25 y.o. female with history of hypertension, anemia who presents to the emergency department with complaints of diffuse, crampy abdominal pain that started Sunday, April 3.  Then started having vomiting the next day and then diarrhea yesterday.  Reports her children have similar symptoms and she also works in a daycare.  No fever today but states she thinks she did have one several days ago.  No dysuria, hematuria, vaginal bleeding or discharge.  No history of previous abdominal surgery.        Past Medical History:  Diagnosis Date  . Anemia   . Hypertension     Patient Active Problem List   Diagnosis Date Noted  . Labor and delivery, indication for care 04/03/2018  . Indication for care in labor or delivery 05/24/2016    History reviewed. No pertinent surgical history.  Prior to Admission medications   Medication Sig Start Date End Date Taking? Authorizing Provider  dicyclomine (BENTYL) 20 MG tablet Take 1 tablet (20 mg total) by mouth every 8 (eight) hours as needed for spasms (Abdominal cramping). 05/06/20  Yes Jaterrius Ricketson N, DO  ondansetron (ZOFRAN ODT) 4 MG disintegrating tablet Take 1 tablet (4 mg total) by mouth every 6 (six) hours as needed for nausea or vomiting. 05/06/20  Yes Zaelyn Noack, Layla Maw, DO  fluconazole (DIFLUCAN) 150 MG tablet Take 1 tablet by mouth today, then repeat dose in 3 days if symptoms have not fully resolved. 12/21/19   Lucy Chris, PA  meloxicam (MOBIC) 15 MG tablet Take 1 tablet (15 mg total) by mouth daily as needed for pain. 08/05/18   Tommie Sams, DO  naproxen (NAPROSYN) 500 MG tablet Take 1 tablet  (500 mg total) by mouth 2 (two) times daily with a meal. 10/09/18   Joni Reining, PA-C  NIFEdipine (PROCARDIA-XL/NIFEDICAL-XL) 30 MG 24 hr tablet Take 30 mg by mouth daily.    [provider]  orphenadrine (NORFLEX) 100 MG tablet Take 1 tablet (100 mg total) by mouth 2 (two) times daily. 10/09/18   Joni Reining, PA-C  tiZANidine (ZANAFLEX) 4 MG tablet Take 1 tablet (4 mg total) by mouth every 8 (eight) hours as needed for muscle spasms. 08/05/18   Tommie Sams, DO    Allergies Patient has no known allergies.  Family History  Problem Relation Age of Onset  . Hypertension Mother     Social History Social History   Tobacco Use  . Smoking status: Never Smoker  . Smokeless tobacco: Never Used  Vaping Use  . Vaping Use: Never used  Substance Use Topics  . Alcohol use: No  . Drug use: Never    Review of Systems Constitutional: + fever. Eyes: No visual changes. ENT: No sore throat. Cardiovascular: Denies chest pain. Respiratory: Denies shortness of breath. Gastrointestinal: + nausea, vomiting, diarrhea. Genitourinary: Negative for dysuria. Musculoskeletal: Negative for back pain. Skin: Negative for rash. Neurological: Negative for focal weakness or numbness.  ____________________________________________   PHYSICAL EXAM:  VITAL SIGNS: ED Triage Vitals  Enc Vitals Group     BP 05/06/20 0123 115/72     Pulse Rate 05/06/20 0123 87  Resp 05/06/20 0123 16     Temp 05/06/20 0123 97.6 F (36.4 C)     Temp Source 05/06/20 0123 Oral     SpO2 05/06/20 0123 100 %     Weight 05/06/20 0124 145 lb (65.8 kg)     Height 05/06/20 0124 5\' 2"  (1.575 m)     Head Circumference --      Peak Flow --      Pain Score 05/06/20 0124 10     Pain Loc --      Pain Edu? --      Excl. in GC? --    CONSTITUTIONAL: Alert and oriented and responds appropriately to questions. Well-appearing; well-nourished HEAD: Normocephalic EYES: Conjunctivae clear, pupils appear equal, EOM appear  intact ENT: normal nose; moist mucous membranes NECK: Supple, normal ROM CARD: RRR; S1 and S2 appreciated; no murmurs, no clicks, no rubs, no gallops RESP: Normal chest excursion without splinting or tachypnea; breath sounds clear and equal bilaterally; no wheezes, no rhonchi, no rales, no hypoxia or respiratory distress, speaking full sentences ABD/GI: Normal bowel sounds; non-distended; soft, non-tender, no rebound, no guarding, no peritoneal signs, no hepatosplenomegaly, no tenderness at McBurney's point, negative Murphy sign BACK: The back appears normal EXT: Normal ROM in all joints; no deformity noted, no edema; no cyanosis SKIN: Normal color for age and race; warm; no rash on exposed skin NEURO: Moves all extremities equally PSYCH: The patient's mood and manner are appropriate.  ____________________________________________   LABS (all labs ordered are listed, but only abnormal results are displayed)  Labs Reviewed  CBC - Abnormal; Notable for the following components:      Result Value   Hemoglobin 11.3 (*)    HCT 35.5 (*)    MCV 70.6 (*)    MCH 22.5 (*)    All other components within normal limits  URINALYSIS, COMPLETE (UACMP) WITH MICROSCOPIC - Abnormal; Notable for the following components:   Color, Urine YELLOW (*)    APPearance HAZY (*)    Hgb urine dipstick MODERATE (*)    Ketones, ur 80 (*)    Protein, ur 30 (*)    RBC / HPF >50 (*)    Bacteria, UA RARE (*)    All other components within normal limits  COMPREHENSIVE METABOLIC PANEL - Abnormal; Notable for the following components:   Potassium 3.4 (*)    CO2 21 (*)    Glucose, Bld 121 (*)    Calcium 8.8 (*)    AST 196 (*)    ALT 91 (*)    All other components within normal limits  URINE CULTURE  LIPASE, BLOOD  POC URINE PREG, ED   ____________________________________________  EKG  none ____________________________________________  RADIOLOGY I, Nakeda Lebron, personally viewed and evaluated these  images (plain radiographs) as part of my medical decision making, as well as reviewing the written report by the radiologist.  ED MD interpretation:  none  Official radiology report(s): No results found.  ____________________________________________   PROCEDURES  Procedure(s) performed (including Critical Care):  Procedures   ____________________________________________   INITIAL IMPRESSION / ASSESSMENT AND PLAN / ED COURSE  As part of my medical decision making, I reviewed the following data within the electronic MEDICAL RECORD NUMBER Nursing notes reviewed and incorporated, Labs reviewed , Old chart reviewed and Notes from prior ED visits         Patient here with nausea, vomiting and diarrhea.  Abdominal exam benign.  Suspect viral gastroenteritis especially given children with recent similar symptoms.  Low suspicion for cholecystitis, pancreatitis, colitis, diverticulitis, appendicitis, bowel obstruction, perforation.  Labs reassuring other than minimally elevated AST and ALT.  I discussed these findings with patient and recommend close follow-up with her primary care doctor.  Normal total bilirubin and lipase.  Given she has no right upper quadrant tenderness on exam, I do not feel she needs an emergent abdominal ultrasound.  She did receive Tylenol in the waiting room per her request but will avoid additional Tylenol, recommend she avoids alcohol as well.  Will give IV fluids, Zofran, Bentyl, Imodium for symptomatic relief and p.o. challenge.  ED PROGRESS  6:10 AM  Pt tolerating p.o. and reports feeling better.  Will obtain urinalysis, urine pregnancy test.  6:53 AM  Pt's pregnancy test is negative.  Her urine does show large ketones.  Will give second liter of IV fluids.  She also has greater than 50 red blood cells and rare bacteria.  No other sign of infection.  We will add on urine culture.  She denies any vaginal bleeding stating that she has not on her menstrual cycle.  No  history of kidney stones but is complaining of lower back pain.  Will obtain CT renal study for further evaluation.  Will give Toradol for pain.  7:10 AM  Signed out to Dr. Vicente Males to follow up on CT imaging.  I reviewed all nursing notes and pertinent previous records as available.  I have reviewed and interpreted any EKGs, lab and urine results, imaging (as available).  ____________________________________________   FINAL CLINICAL IMPRESSION(S) / ED DIAGNOSES  Final diagnoses:  Nausea vomiting and diarrhea  Other microscopic hematuria  Elevated liver function tests     ED Discharge Orders         Ordered    ondansetron (ZOFRAN ODT) 4 MG disintegrating tablet  Every 6 hours PRN        05/06/20 0616    dicyclomine (BENTYL) 20 MG tablet  Every 8 hours PRN        05/06/20 0616          *Please note:  Jean Wise was evaluated in Emergency Department on 05/06/2020 for the symptoms described in the history of present illness. She was evaluated in the context of the global COVID-19 pandemic, which necessitated consideration that the patient might be at risk for infection with the SARS-CoV-2 virus that causes COVID-19. Institutional protocols and algorithms that pertain to the evaluation of patients at risk for COVID-19 are in a state of rapid change based on information released by regulatory bodies including the CDC and federal and state organizations. These policies and algorithms were followed during the patient's care in the ED.  Some ED evaluations and interventions may be delayed as a result of limited staffing during and the pandemic.*   Note:  This document was prepared using Dragon voice recognition software and may include unintentional dictation errors.   Deziree Mokry, Layla Maw, DO 05/06/20 [phone]

## 2020-05-06 NOTE — ED Triage Notes (Signed)
Pt to ED via EMS from home c/o generalized abd pain since Sunday with n/v starting Monday and diarrhea started yesterday.  Pt given 4mg  zofran en route by EMS.

## 2020-05-06 NOTE — ED Notes (Signed)
Pt in CT.

## 2020-05-06 NOTE — ED Notes (Signed)
Pt advised that urine sample is needed. Pt made aware to hit call bell when she needs to urinate so that she can be disconnected from monitors.

## 2020-05-06 NOTE — ED Notes (Signed)
Paper consent for discharge signed. E pad not working.

## 2020-05-06 NOTE — Discharge Instructions (Addendum)
You may take ibuprofen 800 mg every 6 hours as needed for pain.  This medication is found over-the-counter.  You to have minimally elevated liver function test.  I recommend you have a primary care doctor recheck this in 1 week.  Please avoid Tylenol, alcohol at this time.  You may use over-the-counter Imodium as needed for diarrhea.   Steps to find a Primary Care Provider (PCP):  Call [phone] or [phone] to access "[facility] Find a Doctor Service."  2.  You may also go on the [facility] website at [email or web address]

## 2020-05-07 DIAGNOSIS — R748 Abnormal levels of other serum enzymes: Secondary | ICD-10-CM | POA: Insufficient documentation

## 2020-05-08 LAB — URINE CULTURE: Culture: 30000 — AB

## 2020-08-13 IMAGING — MR MR MRV HEAD W/O CM
2 series · 20 of 48 positions shown · non-contrast
Comparison: None.

CLINICAL DATA: Bilateral leg weakness.

EXAM:
MR VENOGRAM HEAD WITHOUT CONTRAST
TECHNIQUE: Angiographic images of the intracranial venous structures were
obtained using MRV technique without intravenous contrast.

[Series 5: MRV · coronal · 3.0mm · 0.43mm/px · 10 of 64 slices shown]
[im 3/64]
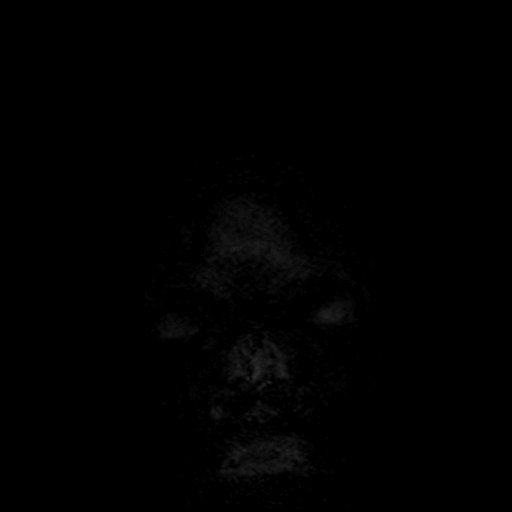
[im 11/64]
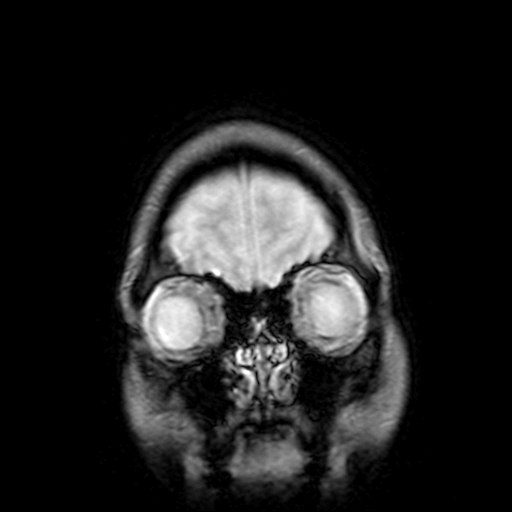
[im 20/64]
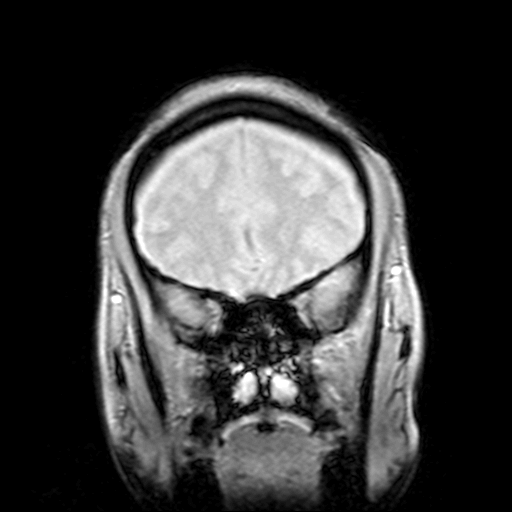
[im 28/64]
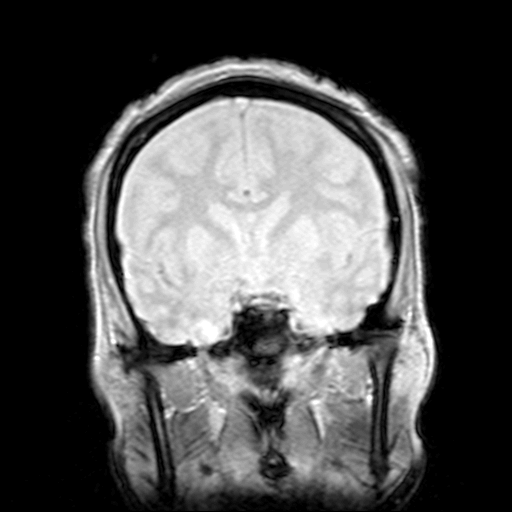
[im 33/64]
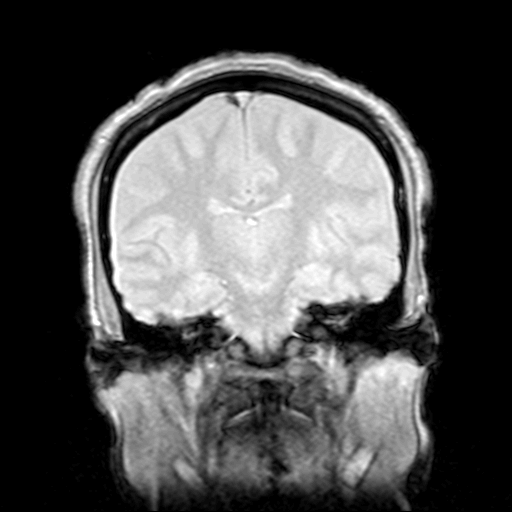
[im 36/64]
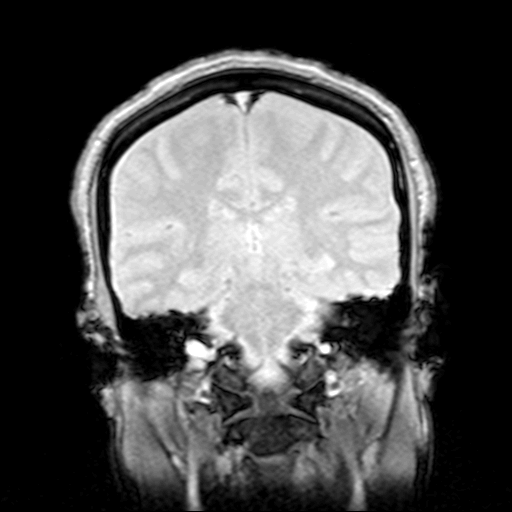
[im 44/64]
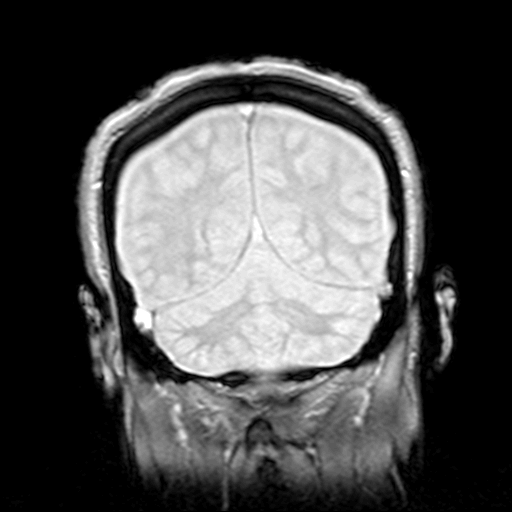
[im 53/64]
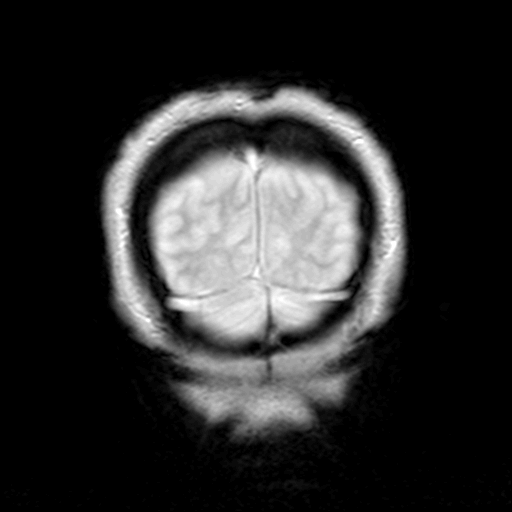
[im 55/64]
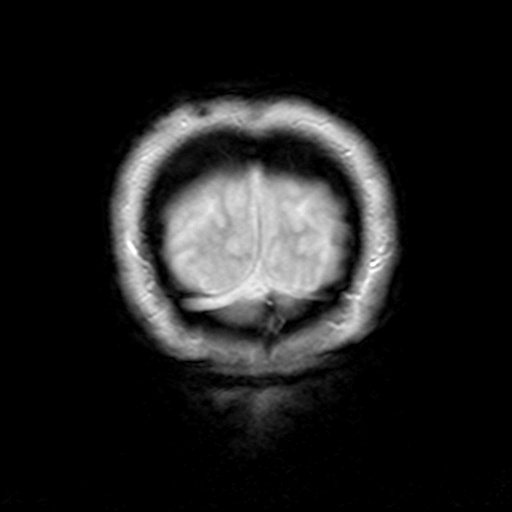
[im 61/64]
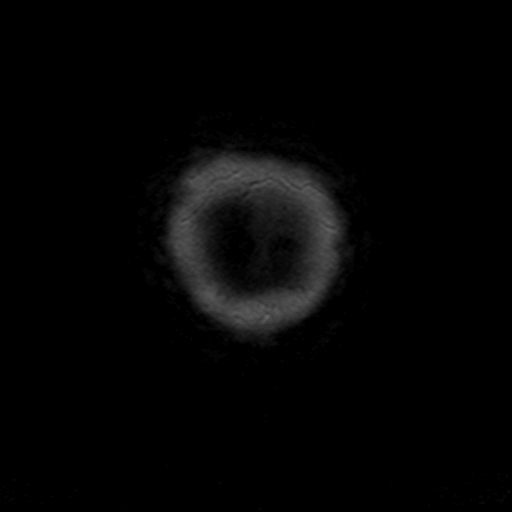

[Series 6: mrv_msum · coronal · 3.0mm · 0.43mm/px · 10 of 64 slices shown]
[im 3/64]
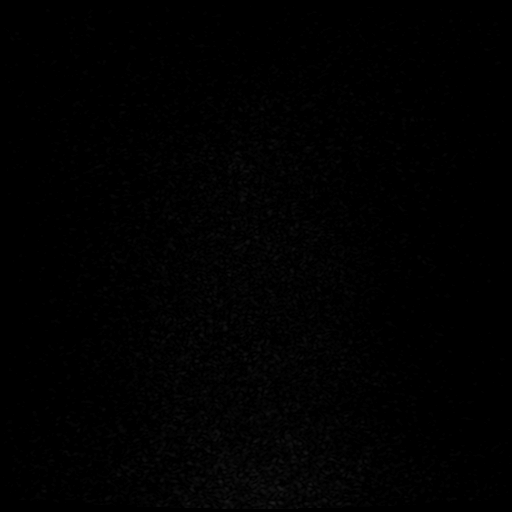
[im 11/64]
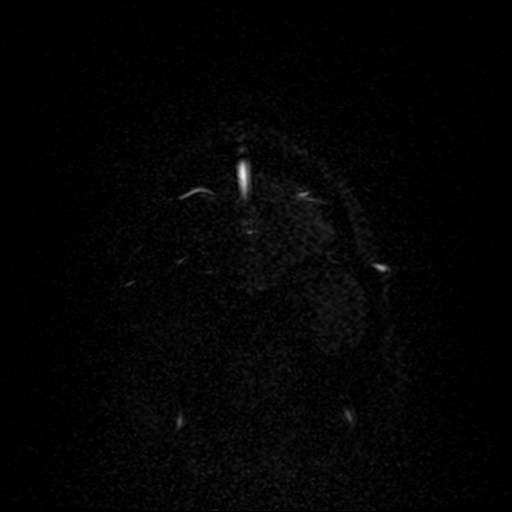
[im 20/64]
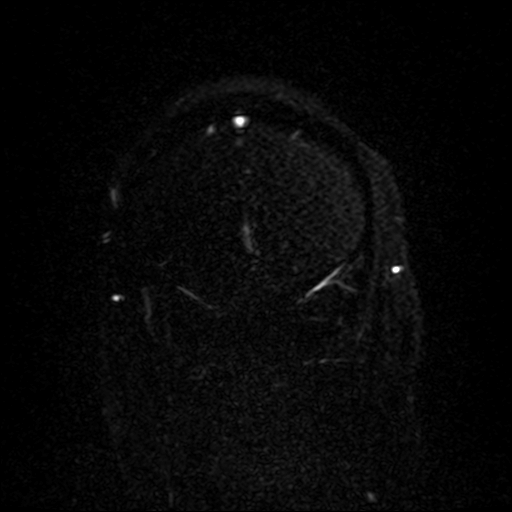
[im 28/64]
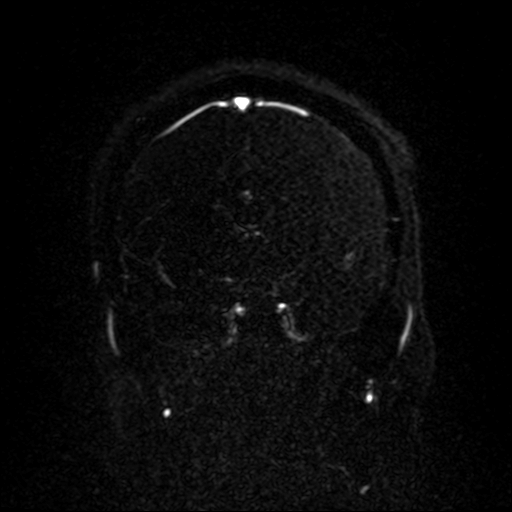
[im 33/64]
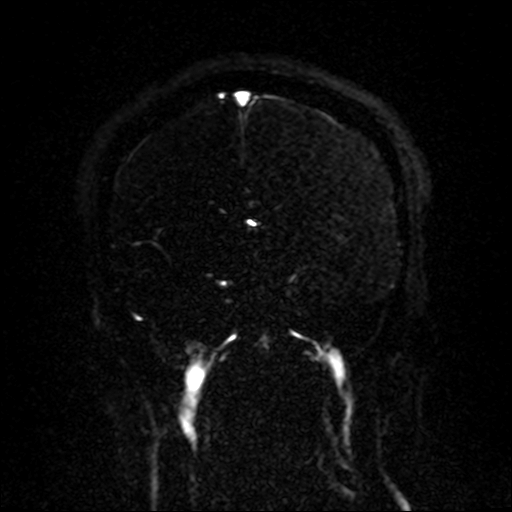
[im 36/64]
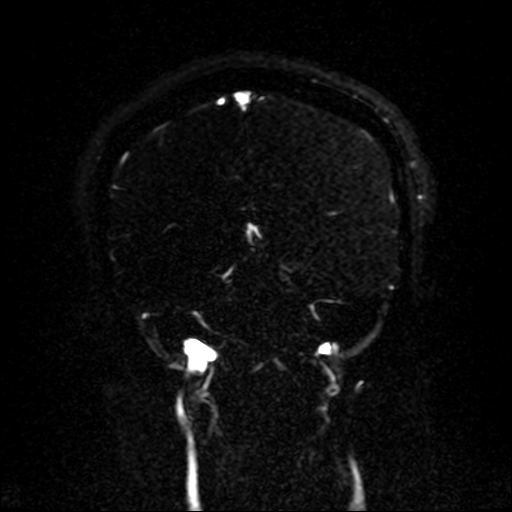
[im 44/64]
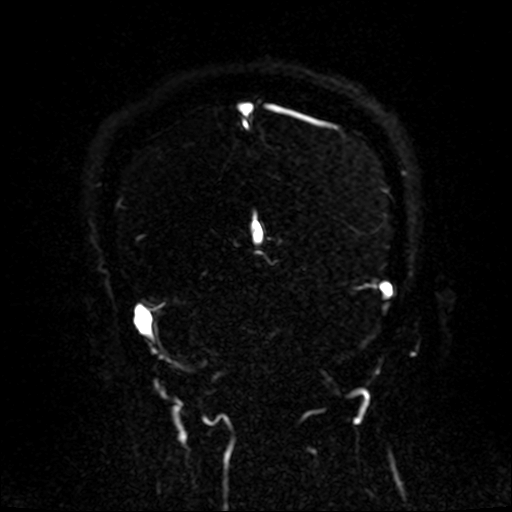
[im 53/64]
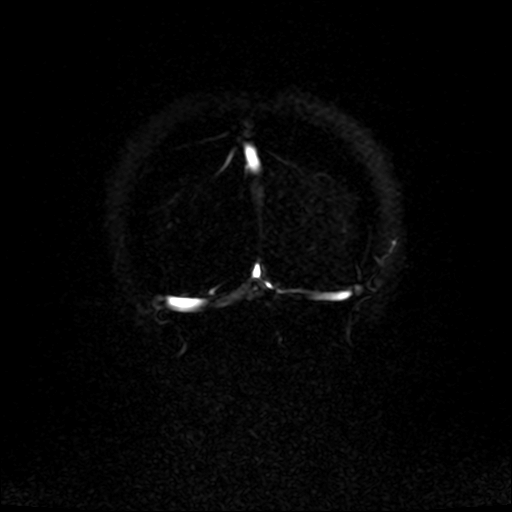
[im 55/64]
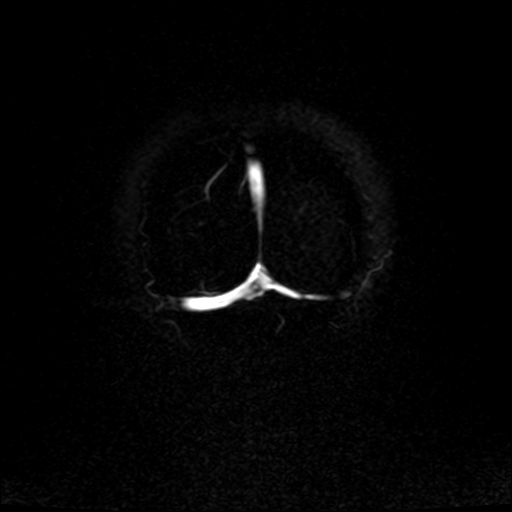
[im 61/64]
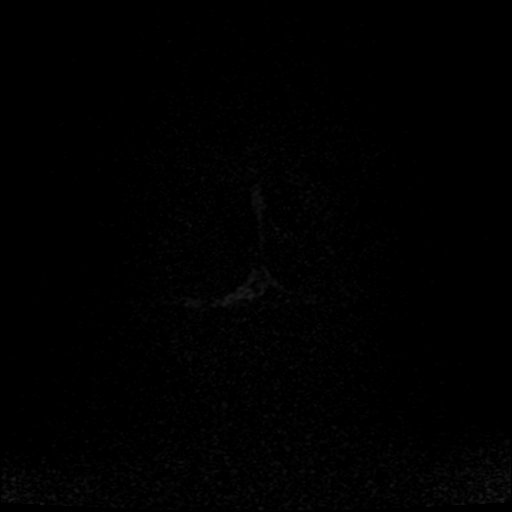

[20 of 48 positions shown; findings below may reference images not displayed]

FINDINGS: On the scout sequence there is no hydrocephalus, shift, or other
asymmetry. Non-contrast MRV shows dominant right transverse sigmoid
system. There is no filling defect within the major dural venous
sinuses or paired deep veins to suggest thrombosis. Relatively
narrow appearance of the proximal left transverse sinus is
attributed to hypoplasia rather than luminal defect based on source
images.
IMPRESSION: Negative non-contrast MRV.

## 2020-09-14 IMAGING — US US OB LIMITED
2 series · 13 of 28 positions shown · non-contrast
Comparison: none

CLINICAL DATA: 17 week twin pregnancy. Passed mucous plug last
night.

EXAM:
LIMITED OBSTETRIC ULTRASOUND

[Series 1: us ob limited · 11 of 31 slices shown (1 of 2)]
[im 2/31]
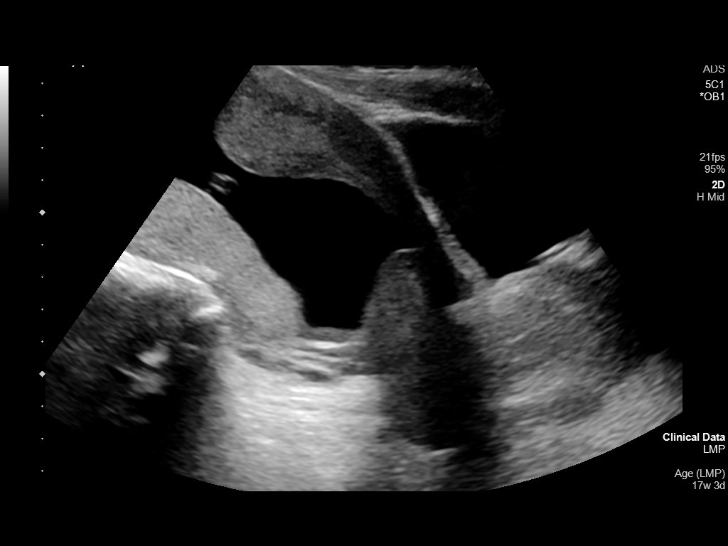
[im 4/31]
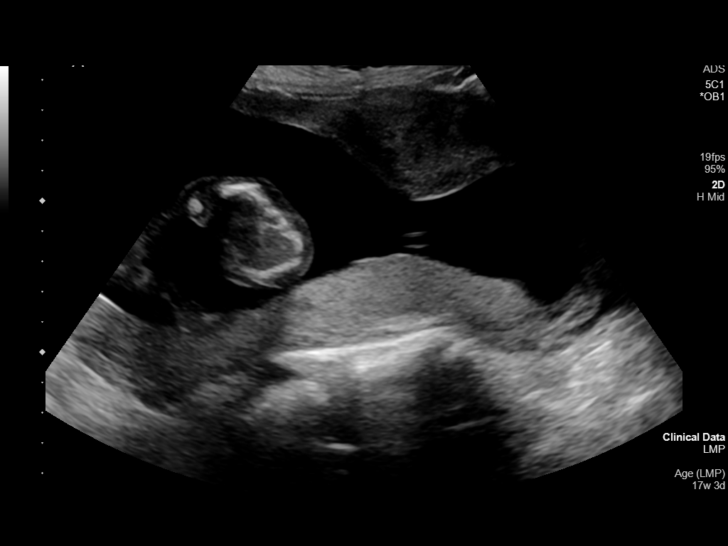
[im 7/31]
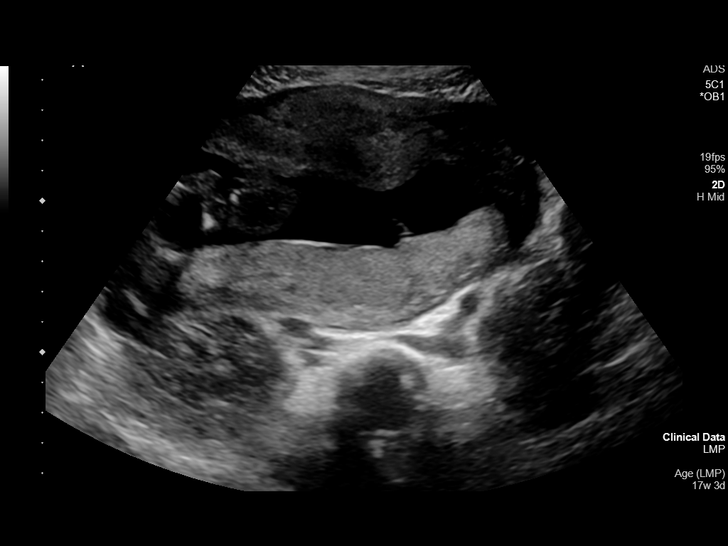
[im 10/31]
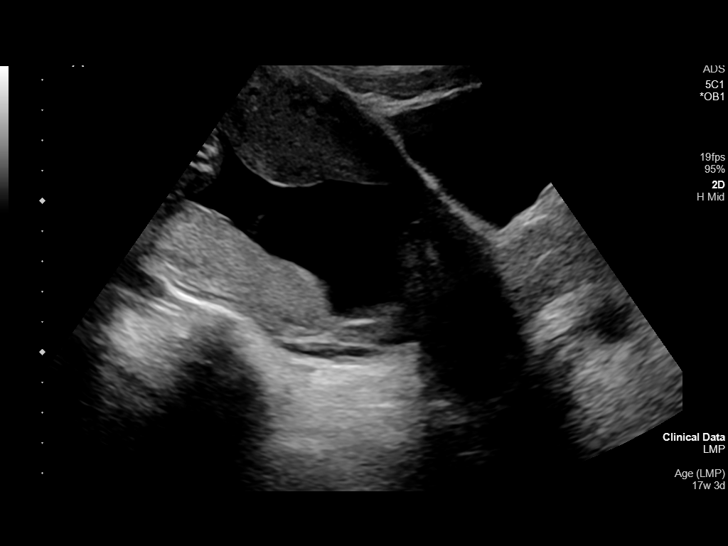
[im 12/31]
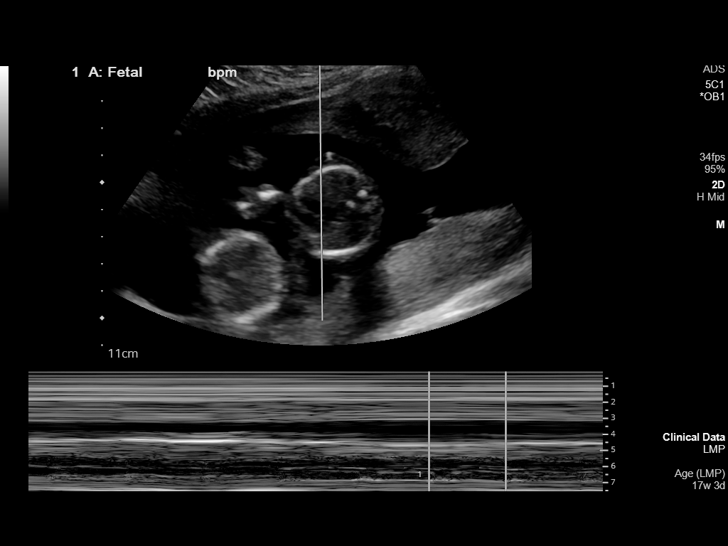
[im 15/31]
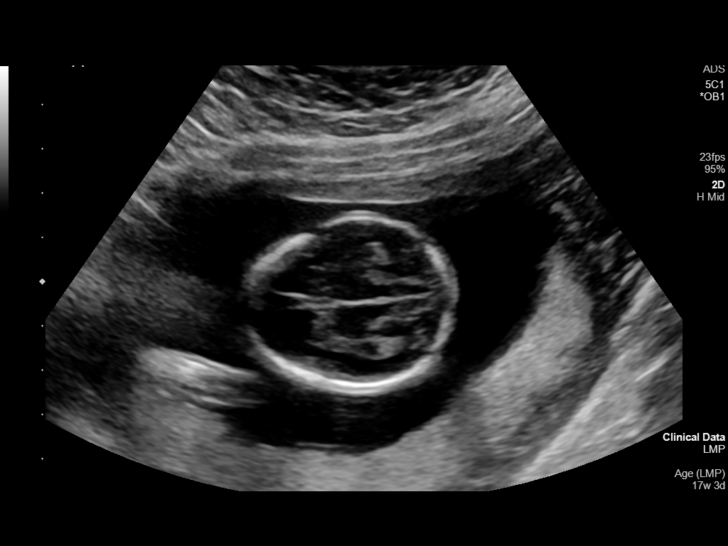
[im 19/31]
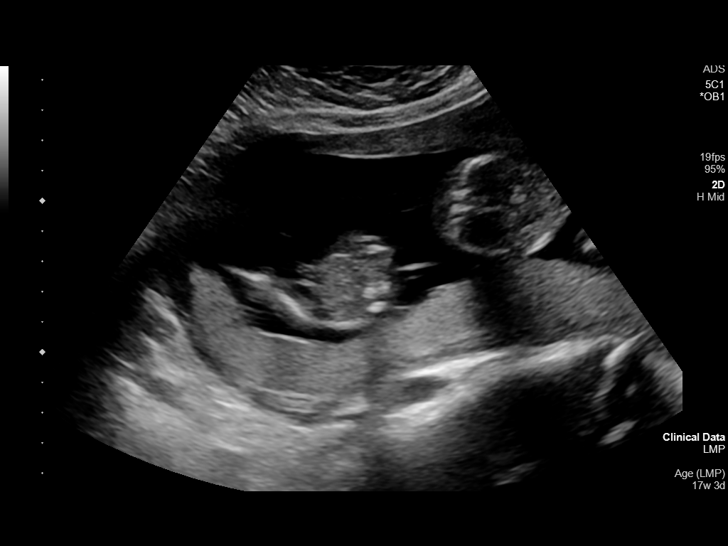
[im 21/31]
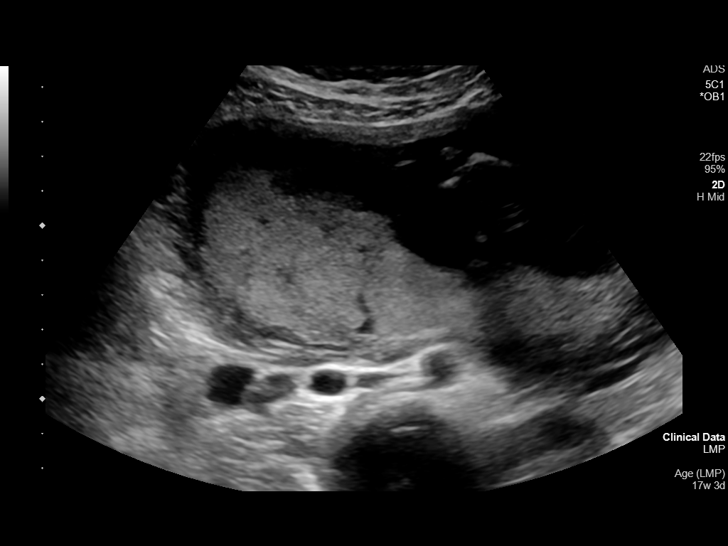
[im 24/31]
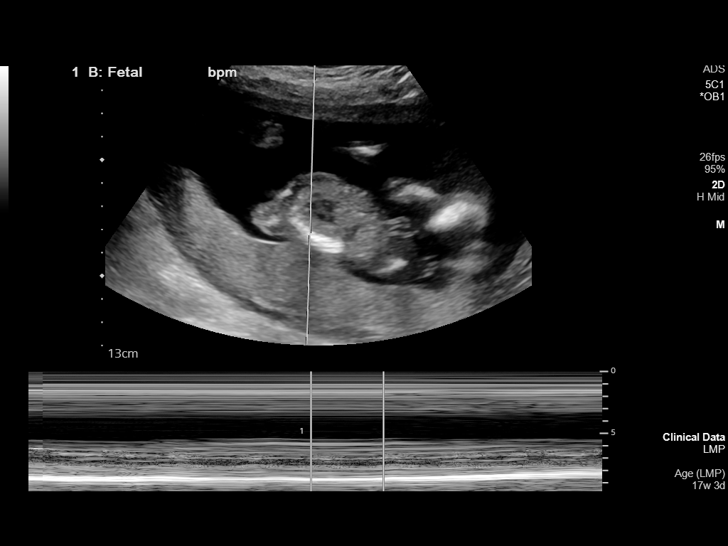
[im 27/31]
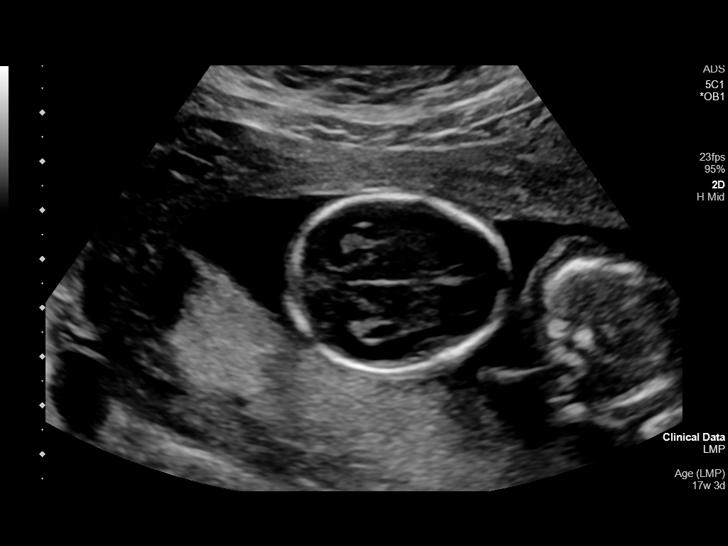
[im 29/31]
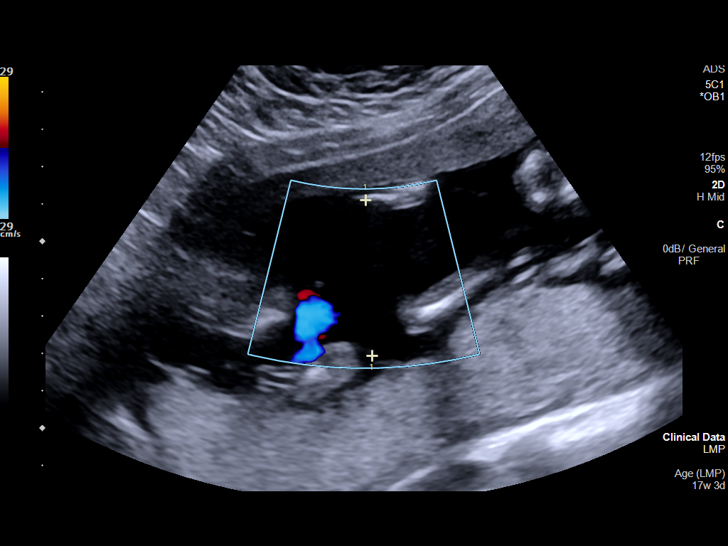

[Series 1001: us ob limited · 2 of 5 slices shown (2 of 2)]
[im 1/5]
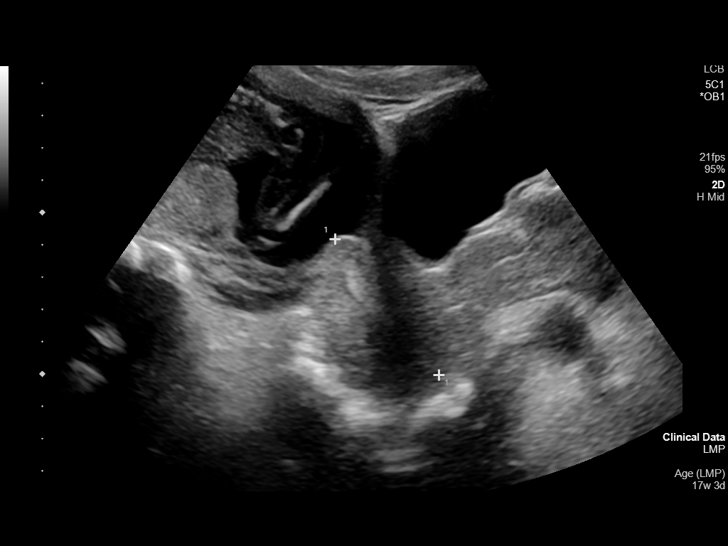
[im 3/5]
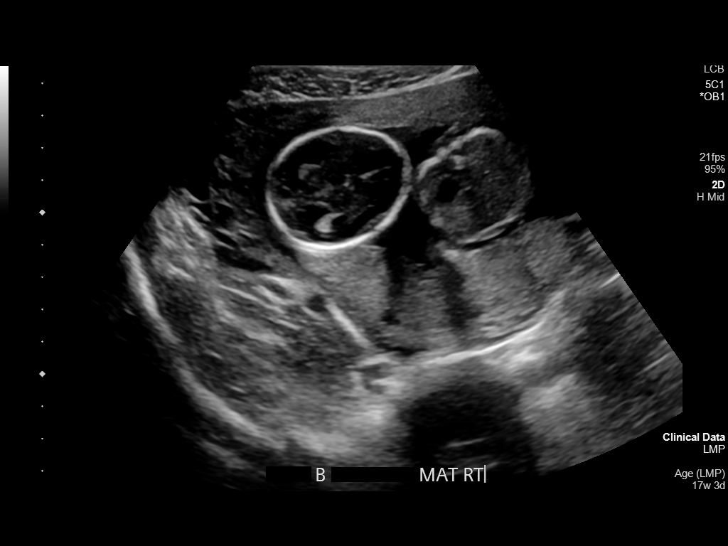

[13 of 28 positions shown; findings below may reference images not displayed]

FINDINGS: Number of Fetuses:  2

Separating Membrane: Thin, consistent with monochorionic/diamniotic
twin pregnancy

TWIN 1

Heart Rate:  133 bpm

Movement: Yes

Presentation: Transverse lie with head to maternal left

Placental Location: Posterior

Previa: No

Amniotic Fluid (Subjective):  Within normal limits

BPD:  3.8cm 17w 2d

TWIN 2

Heart Rate:  141 bpm

Movement: Yes

Presentation: Cephalic

Placental Location: Posterior

Previa: No

Amniotic Fluid (Subjective): Within normal limits.

BPD:  3.6cm 17w 0d

MATERNAL FINDINGS:

Cervix:  Appears closed.  Measures 5.3 cm in length TA.
IMPRESSION: Living monochorionic/diamniotic twin IUP. Normal amniotic fluid
volume for both twins.

No evidence of placenta previa or abruption. Normal cervical length.

(This exam is performed on an emergent basis and does not
comprehensively evaluate fetal size, dating, or anatomy; follow-up
complete OB US should be considered if further fetal assessment is
warranted.)

## 2021-07-05 DIAGNOSIS — Z3046 Encounter for surveillance of implantable subdermal contraceptive: Secondary | ICD-10-CM | POA: Insufficient documentation

## 2021-07-06 DIAGNOSIS — J029 Acute pharyngitis, unspecified: Secondary | ICD-10-CM | POA: Insufficient documentation

## 2022-03-21 ENCOUNTER — Encounter: Payer: Self-pay | Admitting: Emergency Medicine

## 2022-03-21 ENCOUNTER — Ambulatory Visit
Admission: EM | Admit: 2022-03-21 | Discharge: 2022-03-21 | Disposition: A | Discharge status: Home / Self Care | Payer: Medicaid Other | Attending: Family Medicine | Admitting: Family Medicine

## 2022-03-21 DIAGNOSIS — R509 Fever, unspecified: Secondary | ICD-10-CM | POA: Insufficient documentation

## 2022-03-21 DIAGNOSIS — I10 Essential (primary) hypertension: Secondary | ICD-10-CM | POA: Diagnosis not present

## 2022-03-21 DIAGNOSIS — J101 Influenza due to other identified influenza virus with other respiratory manifestations: Secondary | ICD-10-CM | POA: Insufficient documentation

## 2022-03-21 DIAGNOSIS — Z1152 Encounter for screening for COVID-19: Secondary | ICD-10-CM | POA: Diagnosis not present

## 2022-03-21 LAB — GROUP A STREP BY PCR: Group A Strep by PCR: NOT DETECTED

## 2022-03-21 LAB — RESP PANEL BY RT-PCR (RSV, FLU A&B, COVID)  RVPGX2
Influenza A by PCR: POSITIVE — AB
Influenza B by PCR: NEGATIVE
Resp Syncytial Virus by PCR: NEGATIVE
SARS Coronavirus 2 by RT PCR: NEGATIVE

## 2022-03-21 MED ORDER — OSELTAMIVIR PHOSPHATE 75 MG PO CAPS: 75.0000 mg | ORAL_CAPSULE | Freq: Two times a day (BID) | ORAL | 0 refills | Status: DC

## 2022-03-21 NOTE — ED Triage Notes (Signed)
Pt c/o sore throat, fever, body aches. Started yesterday. 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

## 2022-03-21 NOTE — ED Provider Notes (Addendum)
[facility]    CSN: [id number] Arrival date & time: 03/21/22  0915      History   Chief Complaint Chief Complaint  Patient presents with   Sore Throat    HPI Jean Wise is a 27 y.o. female.   HPI   Jean Wise presents for bodyaches, sore throat, eyes hurt that started yesterday. She is a Freight forwarder.  Tmax 102 F. Took Mucinex, Ibuprofen.No meds this morning.    Fever : yes Chills: no Sore throat: yes Cough: no Sneezing: yes Nasal congestion:no Rhinorrhea: yes Myalgias: yes Appetite: decreased Hydration: normal  Abdominal pain: resolved  Nausea: no Vomiting: no Diarrhea: No Rash: No Sleep disturbance: no Headache: no    She did take her BP meds this mornign.   Past Medical History:  Diagnosis Date   Anemia    Hypertension     Patient Active Problem List   Diagnosis Date Noted   Labor and delivery, indication for care 04/03/2018   Indication for care in labor or delivery 05/24/2016    History reviewed. No pertinent surgical history.  OB History     Gravida  2   Para  1   Term      Preterm  1   AB      Living  1      SAB      IAB      Ectopic      Multiple      Live Births  1            Home Medications    Prior to Admission medications   Medication Sig Start Date End Date Taking? Authorizing Provider  amLODipine (NORVASC) 5 MG tablet Take 1 tablet by mouth daily. 02/28/22 02/28/23 Yes [provider]  oseltamivir (TAMIFLU) 75 MG capsule Take 1 capsule (75 mg total) by mouth every 12 (twelve) hours. 03/21/22  Yes Betzabeth Derringer, DO  dicyclomine (BENTYL) 20 MG tablet Take 1 tablet (20 mg total) by mouth every 8 (eight) hours as needed for spasms (Abdominal cramping). 05/06/20   Ward, Delice Bison, DO  fluconazole (DIFLUCAN) 150 MG tablet Take 1 tablet by mouth today, then repeat dose in 3 days if symptoms have not fully resolved. 12/21/19   Marlana Salvage, PA  meloxicam (MOBIC) 15 MG tablet Take 1  tablet (15 mg total) by mouth daily as needed for pain. 08/05/18   Coral Spikes, DO  naproxen (NAPROSYN) 500 MG tablet Take 1 tablet (500 mg total) by mouth 2 (two) times daily with a meal. 10/09/18   Sable Feil, PA-C  ondansetron (ZOFRAN ODT) 4 MG disintegrating tablet Take 1 tablet (4 mg total) by mouth every 6 (six) hours as needed for nausea or vomiting. 05/06/20   Ward, Delice Bison, DO  orphenadrine (NORFLEX) 100 MG tablet Take 1 tablet (100 mg total) by mouth 2 (two) times daily. 10/09/18   Sable Feil, PA-C  tiZANidine (ZANAFLEX) 4 MG tablet Take 1 tablet (4 mg total) by mouth every 8 (eight) hours as needed for muscle spasms. 08/05/18   Coral Spikes, DO    Family History Family History  Problem Relation Age of Onset   Hypertension Mother     Social History Social History   Tobacco Use   Smoking status: Never   Smokeless tobacco: Never  Vaping Use   Vaping Use: Never used  Substance Use Topics   Alcohol use: No   Drug use: Never  Allergies   Patient has no known allergies.   Review of Systems Review of Systems: negative unless otherwise stated in HPI.      Physical Exam Triage Vital Signs ED Triage Vitals  Enc Vitals Group     BP      Pulse      Resp      Temp      Temp src      SpO2      Weight      Height      Head Circumference      Peak Flow      Pain Score      Pain Loc      Pain Edu?      Excl. in [place]?    No data found.  Updated Vital Signs BP (!) 159/94 (BP Location: Left Arm)   Pulse 92   Temp 99.9 F (37.7 C) (Oral)   Resp 16   Ht 5' 2"$  (1.575 m)   Wt 65.8 kg   SpO2 97%   Breastfeeding No   BMI 26.53 kg/m   Visual Acuity Right Eye Distance:   Left Eye Distance:   Bilateral Distance:    Right Eye Near:   Left Eye Near:    Bilateral Near:     Physical Exam GEN:     alert, ill but non-toxic appearing female in no distress listening to music   HENT:  mucus membranes moist, oropharyngeal without lesions or exudate, no  tonsillar hypertrophy, mild oropharyngeal erythema, clear nasal discharge, bilateral TM normal EYES:   pupils equal and reactive, no scleral injection or discharge NECK:  normal ROM, no meningismus   RESP:  no increased work of breathing, clear to auscultation bilaterally CVS:   regular rate and rhythm Skin:   warm and dry, no rash on visible skin    UC Treatments / Results  Labs (all labs ordered are listed, but only abnormal results are displayed) Labs Reviewed  RESP PANEL BY RT-PCR (RSV, FLU A&B, COVID)  RVPGX2 - Abnormal; Notable for the following components:      Result Value   Influenza A by PCR POSITIVE (*)    All other components within normal limits  GROUP A STREP BY PCR    EKG   Radiology No results found.  Procedures Procedures (including critical care time)  Medications Ordered in UC Medications - No data to display  Initial Impression / Assessment and Plan / UC Course  I have reviewed the triage vital signs and the nursing notes.  Pertinent labs & imaging results that were available during my care of the patient were reviewed by me and considered in my medical decision making (see chart for details).       Pt is a 27 y.o. female who presents for 1-2 days of respiratory symptoms. Jean Wise has an elevated temperature here of 99.52F. Satting well on room air. Overall pt is ill but non-toxic appearing, well hydrated, without respiratory distress. Pulmonary exam is unremarkable.  Strep PCR, COVID and influenza testing obtained and of those she is influenza A positive. Discussed symptomatic treatment.  Tamiflu sent to pharmacy. Typical duration of symptoms discussed. Work note provided.   She is hypertensive, BP 159/94.  She did not take her medications this morning. On chart review, she was previously on nifedipine and now takes amlodipine 5 mg. She has several elevated blood pressures going back to 2017. Advised to take BP medications when she gets home and follow up  with her PCP if BP continues to be elevated in the next 2 weeks.   Return and ED precautions given and voiced understanding. Discussed MDM, treatment plan and plan for follow-up with patient who agrees with plan.     Final Clinical Impressions(s) / UC Diagnoses   Final diagnoses:  Influenza A  Elevated blood pressure reading with diagnosis of hypertension     Discharge Instructions      You have the flu.  Specifically influenza A.  Your COVID, RSV and strep tests were all negative.  Tamiflu was sent to your pharmacy.  You can take Tylenol and/or Ibuprofen as needed for fever reduction, body aches and pain relief.   Be sure to take your blood pressure medications when you get home. Follow up with your primary care provider if your blood pressure remains elevated >135 / >80 after recovering from the flu.     For cough: honey 1/2 to 1 teaspoon (you can dilute the honey in water or another fluid).  You can also use guaifenesin and dextromethorphan for cough. You can use a humidifier for chest congestion and cough.  If you don't have a humidifier, you can sit in the bathroom with the hot shower running.      For sore throat: try warm salt water gargles, Mucinex sore throat cough drops or cepacol lozenges, throat spray, warm tea or water with lemon/honey, popsicles or ice, or OTC cold relief medicine for throat discomfort. You can also purchase chloraseptic spray at the pharmacy or dollar store.   For congestion: take a daily anti-histamine like Zyrtec, Claritin, and a oral decongestant, such as pseudoephedrine.  You can also use Flonase 1-2 sprays in each nostril daily. Afrin is also a good option, if you do not have high blood pressure.    It is important to stay hydrated: drink plenty of fluids (water, gatorade/powerade/pedialyte, juices, or teas) to keep your throat moisturized and help further relieve irritation/discomfort.    Return or go to the Emergency Department if symptoms worsen or  do not improve in the next few days      ED Prescriptions     Medication Sig Dispense Auth. Provider   oseltamivir (TAMIFLU) 75 MG capsule Take 1 capsule (75 mg total) by mouth every 12 (twelve) hours. 10 capsule Lyndee Hensen, DO      PDMP not reviewed this encounter.     Lyndee Hensen, DO 03/21/22 1132

## 2022-03-21 NOTE — Discharge Instructions (Addendum)
You have the flu.  Specifically influenza A.  Your COVID, RSV and strep tests were all negative.  Tamiflu was sent to your pharmacy.  You can take Tylenol and/or Ibuprofen as needed for fever reduction, body aches and pain relief.   Be sure to take your blood pressure medications when you get home. Follow up with your primary care provider if your blood pressure remains elevated >135 / >80 after recovering from the flu.     For cough: honey 1/2 to 1 teaspoon (you can dilute the honey in water or another fluid).  You can also use guaifenesin and dextromethorphan for cough. You can use a humidifier for chest congestion and cough.  If you don't have a humidifier, you can sit in the bathroom with the hot shower running.      For sore throat: try warm salt water gargles, Mucinex sore throat cough drops or cepacol lozenges, throat spray, warm tea or water with lemon/honey, popsicles or ice, or OTC cold relief medicine for throat discomfort. You can also purchase chloraseptic spray at the pharmacy or dollar store.   For congestion: take a daily anti-histamine like Zyrtec, Claritin, and a oral decongestant, such as pseudoephedrine.  You can also use Flonase 1-2 sprays in each nostril daily. Afrin is also a good option, if you do not have high blood pressure.    It is important to stay hydrated: drink plenty of fluids (water, gatorade/powerade/pedialyte, juices, or teas) to keep your throat moisturized and help further relieve irritation/discomfort.    Return or go to the Emergency Department if symptoms worsen or do not improve in the next few days

## 2023-05-21 ENCOUNTER — Ambulatory Visit
Admission: EM | Admit: 2023-05-21 | Discharge: 2023-05-21 | Disposition: A | Discharge status: Home / Self Care | Attending: Emergency Medicine | Admitting: Emergency Medicine

## 2023-05-21 ENCOUNTER — Encounter: Payer: Self-pay | Admitting: Emergency Medicine

## 2023-05-21 DIAGNOSIS — M7661 Achilles tendinitis, right leg: Secondary | ICD-10-CM | POA: Diagnosis not present

## 2023-05-21 DIAGNOSIS — M79671 Pain in right foot: Secondary | ICD-10-CM

## 2023-05-21 MED ORDER — PREDNISONE 10 MG (21) PO TBPK: ORAL_TABLET | ORAL | 0 refills | Status: AC

## 2023-05-21 MED ORDER — IBUPROFEN 600 MG PO TABS: 600.0000 mg | ORAL_TABLET | Freq: Four times a day (QID) | ORAL | 0 refills | Status: DC | PRN

## 2023-05-21 NOTE — ED Triage Notes (Signed)
 Pt presents with right foot and ankle pain since yesterday. Pt denies any injury.

## 2023-05-21 NOTE — ED Provider Notes (Signed)
 HPI  SUBJECTIVE:  Jean Wise is a 28 y.o. female who presents with right Achilles tendon and right heel soreness described as constant soreness starting yesterday.  No change in physical activity, erythema, bruising, swelling, trauma to the area.  She has been wearing [employer] girl boots recently.  She denies pain or injury to the rest of the ankle or foot.  She reports limited motion with dorsiflexion secondary to pain.  She spends prolonged amounts of time on her feet at work.  She has never had symptoms like this before.  No recent antibiotics, specifically fluoroquinolone use.  She has tried soaking her ankle/foot in the tub and massage without improvement in her symptoms.  Symptoms worse with walking and dorsiflexion.  Past medical history of hypertension, anemia.  No history of plantar fasciitis, Achilles tendon injury, ankle or foot injury.  LMP: Last month.  Denies possibility being pregnant.  PCP: [facility].   Past Medical History:  Diagnosis Date   Anemia    Hypertension     History reviewed. No pertinent surgical history.  Family History  Problem Relation Age of Onset   Hypertension Mother     Social History   Tobacco Use   Smoking status: Never   Smokeless tobacco: Never  Vaping Use   Vaping status: Never Used  Substance Use Topics   Alcohol use: No   Drug use: Never    No current facility-administered medications for this encounter.  Current Outpatient Medications:    ibuprofen  (ADVIL ) 600 MG tablet, Take 1 tablet (600 mg total) by mouth every 6 (six) hours as needed., Disp: 30 tablet, Rfl: 0   predniSONE  (STERAPRED UNI-PAK 21 TAB) 10 MG (21) TBPK tablet, Dispense one 6 day pack. Take as directed with food., Disp: 21 tablet, Rfl: 0   amLODipine (NORVASC) 5 MG tablet, Take 1 tablet by mouth daily., Disp: , Rfl:    dicyclomine  (BENTYL ) 20 MG tablet, Take 1 tablet (20 mg total) by mouth every 8 (eight) hours as needed for spasms (Abdominal cramping).,  Disp: 15 tablet, Rfl: 0   fluconazole  (DIFLUCAN ) 150 MG tablet, Take 1 tablet by mouth today, then repeat dose in 3 days if symptoms have not fully resolved., Disp: 2 tablet, Rfl: 0  No Known Allergies   ROS  As noted in HPI.   Physical Exam  BP (!) 149/105 (BP Location: Left Arm)   Pulse 85   Temp 98.4 F (36.9 C) (Oral)   Resp 18   SpO2 99%   Constitutional: Well developed, well nourished, no acute distress Eyes:  EOMI, conjunctiva normal bilaterally HENT: Normocephalic, atraumatic,mucus membranes moist Respiratory: Normal inspiratory effort Cardiovascular: Normal rate GI: nondistended skin: No rash, skin intact Musculoskeletal: no deformities.  Tenderness along the insertion of the Achilles tendon.  No other tenderness along the Achilles tendon.  No calf tenderness.  No soft tissue defect felt along the Achilles tendon.  Tenderness over the calcaneus and immediately anterior to it.  No tenderness over the plantar fascia.  Rest of the ankle, foot nontender.  Pain with dorsiflexion.  No pain with plantarflexion, inversion/eversion.  DP 2+.  No bruising, swelling, erythema.  Skin intact. Neurologic: Alert & oriented x 3, no focal neuro deficits Psychiatric: Speech and behavior appropriate   ED Course   Medications - No data to display  No orders of the defined types were placed in this encounter.   No results found for this or any previous visit (from the past 24 hours).  No results found.  ED Clinical Impression  1. Achilles tendinitis of right lower extremity   2. Pain of right heel      ED Assessment/Plan     Presentation most consistent with an Achilles tendinitis.  I suspect that she may have a heel spur as well.  Doubt Achilles tendon rupture, reactive tendinitis.  We talked about getting a foot x-ray, but as it would not change management, we have decided to defer that today after shared medical decision making.  Will send home with heel cups,  Tylenol /ibuprofen , 6-day prednisone  taper, ice, work note for 2 days.  Follow-up with podiatry in 10 to 14 days if no better.  Providing information for [employer].  Discussed MDM, treatment plan, and plan for follow-up with patient.patient agrees with plan.   Meds ordered this encounter  Medications   ibuprofen  (ADVIL ) 600 MG tablet    Sig: Take 1 tablet (600 mg total) by mouth every 6 (six) hours as needed.    Dispense:  30 tablet    Refill:  0   predniSONE  (STERAPRED UNI-PAK 21 TAB) 10 MG (21) TBPK tablet    Sig: Dispense one 6 day pack. Take as directed with food.    Dispense:  21 tablet    Refill:  0      *This clinic note was created using Scientist, clinical (histocompatibility and immunogenetics). Therefore, there may be occasional mistakes despite careful proofreading.  ?    Ethlyn Herd, MD 05/22/23 [phone]

## 2023-05-21 NOTE — Discharge Instructions (Signed)
 heel cups, 1000 mg of Tylenol  combined with 600 mg of ibuprofen  3-4 times a day as needed, 6-day prednisone  taper, ice, activity as tolerated.  Follow-up with podiatry in 10 to 14 days if no better.

## 2023-11-05 ENCOUNTER — Ambulatory Visit: Payer: Self-pay | Admitting: Nurse Practitioner

## 2023-11-05 ENCOUNTER — Ambulatory Visit
Admission: EM | Admit: 2023-11-05 | Discharge: 2023-11-05 | Disposition: A | Discharge status: Home / Self Care | Attending: Family Medicine | Admitting: Family Medicine

## 2023-11-05 ENCOUNTER — Ambulatory Visit (INDEPENDENT_AMBULATORY_CARE_PROVIDER_SITE_OTHER)

## 2023-11-05 DIAGNOSIS — M25512 Pain in left shoulder: Secondary | ICD-10-CM

## 2023-11-05 DIAGNOSIS — S40012A Contusion of left shoulder, initial encounter: Secondary | ICD-10-CM

## 2023-11-05 DIAGNOSIS — I10 Essential (primary) hypertension: Secondary | ICD-10-CM | POA: Diagnosis not present

## 2023-11-05 MED ORDER — NAPROXEN 375 MG PO TABS: 375.0000 mg | ORAL_TABLET | Freq: Two times a day (BID) | ORAL | 0 refills | Status: AC | PRN

## 2023-11-05 MED ORDER — KETOROLAC TROMETHAMINE 30 MG/ML IJ SOLN
15.0000 mg | Freq: Once | INTRAMUSCULAR | Status: AC
  Administered 2023-11-05: 15 mg via INTRAMUSCULAR

## 2023-11-05 MED ORDER — AMLODIPINE BESYLATE 5 MG PO TABS: 5.0000 mg | ORAL_TABLET | Freq: Every day | ORAL | 0 refills | Status: AC

## 2023-11-05 NOTE — ED Provider Notes (Signed)
 [facility]    CSN: [id number] Arrival date & time: 11/05/23  1734      History   Chief Complaint Chief Complaint  Patient presents with   Shoulder Pain   Fall    HPI Jean Wise is a 28 y.o. female presents for shoulder pain.  Patient reports yesterday she slipped on some soap in the shower landing onto her left shoulder.  Denies head injury or LOC.  Since then she has been having pain to the anterior and lateral shoulder and feels like it slightly swollen.  Denies any bruising, numbness or tingling.  No neck or back pain.  No history of injuries or surgeries to the shoulder in the past.  She took Tylenol  for symptoms.  Denies any other injuries or concerns at this time.   Shoulder Pain Fall    Past Medical History:  Diagnosis Date   Anemia    Hypertension     Patient Active Problem List   Diagnosis Date Noted   Sore throat 07/06/2021   Encounter for removal and reinsertion of Nexplanon 07/05/2021   Elevated liver enzymes 05/07/2020   Gastroenteritis 06/12/2019   History of COVID-19 01/08/2019   Anxiety 08/16/2018   History of transfusion 08/16/2018   Positive depression screening 04/16/2018   Labor and delivery, indication for care 04/03/2018   Iron deficiency anemia secondary to inadequate dietary iron intake 02/16/2018   Musculoskeletal back pain 02/16/2018   History of abnormal cervical Pap smear 11/07/2017   Indication for care in labor or delivery 05/24/2016   Essential hypertension 06/15/2015    History reviewed. No pertinent surgical history.  OB History     Gravida  2   Para  1   Term      Preterm  1   AB      Living  1      SAB      IAB      Ectopic      Multiple      Live Births  1            Home Medications    Prior to Admission medications   Medication Sig Start Date End Date Taking? Authorizing Provider  amLODipine (NORVASC) 5 MG tablet Take 1 tablet (5 mg total) by mouth daily. 11/05/23  Yes  Keiaira Donlan, Jodi R, NP  naproxen  (NAPROSYN ) 375 MG tablet Take 1 tablet (375 mg total) by mouth 2 (two) times daily as needed. 11/05/23  Yes Melchizedek Espinola, Jodi R, NP  dicyclomine  (BENTYL ) 20 MG tablet Take 1 tablet (20 mg total) by mouth every 8 (eight) hours as needed for spasms (Abdominal cramping). 05/06/20   Ward, Josette SAILOR, DO  fluconazole  (DIFLUCAN ) 150 MG tablet Take 1 tablet by mouth today, then repeat dose in 3 days if symptoms have not fully resolved. 12/21/19   Catha Reche PARAS, PA  predniSONE  (STERAPRED UNI-PAK 21 TAB) 10 MG (21) TBPK tablet Dispense one 6 day pack. Take as directed with food. 05/21/23   Van Knee, MD    Family History Family History  Problem Relation Age of Onset   Hypertension Mother     Social History Social History   Tobacco Use   Smoking status: Never   Smokeless tobacco: Never  Vaping Use   Vaping status: Never Used  Substance Use Topics   Alcohol use: No   Drug use: Never     Allergies   Patient has no known allergies.   Review of Systems Review  of Systems  Musculoskeletal:        Left shoulder pain/injury     Physical Exam Triage Vital Signs ED Triage Vitals  Encounter Vitals Group     BP 11/05/23 1752 (!) 170/112     Girls Systolic BP Percentile --      Girls Diastolic BP Percentile --      Boys Systolic BP Percentile --      Boys Diastolic BP Percentile --      Pulse Rate 11/05/23 1752 78     Resp 11/05/23 1752 16     Temp 11/05/23 1752 98.5 F (36.9 C)     Temp Source 11/05/23 1752 Oral     SpO2 11/05/23 1752 100 %     Weight 11/05/23 1752 150 lb (68 kg)     Height 11/05/23 1752 5' 2 (1.575 m)     Head Circumference --      Peak Flow --      Pain Score 11/05/23 1754 7     Pain Loc --      Pain Education --      Exclude from Growth Chart --    No data found.  Updated Vital Signs BP (!) 176/106 (BP Location: Right Arm)   Pulse 78   Temp 98.5 F (36.9 C) (Oral)   Resp 16   Ht 5' 2 (1.575 m)   Wt 150 lb (68 kg)    SpO2 100%   BMI 27.44 kg/m   Visual Acuity Right Eye Distance:   Left Eye Distance:   Bilateral Distance:    Right Eye Near:   Left Eye Near:    Bilateral Near:     Physical Exam Vitals and nursing note reviewed.  Constitutional:      General: She is not in acute distress.    Appearance: Normal appearance. She is not ill-appearing.  HENT:     Head: Normocephalic and atraumatic.  Eyes:     Pupils: Pupils are equal, round, and reactive to light.  Cardiovascular:     Rate and Rhythm: Normal rate.  Pulmonary:     Effort: Pulmonary effort is normal.  Musculoskeletal:     Left shoulder: Swelling, tenderness and bony tenderness present. No deformity, effusion, laceration or crepitus. Decreased range of motion. Normal strength. Normal pulse.       Arms:     Comments: There is tenderness to the anterior shoulder that extends to the lateral aspect of the shoulder.  Very minimal swelling noted.  No bruising.  Positive Kennedy Hawking's test.  Strength 5 out of 5 bilateral upper extremity  Skin:    General: Skin is warm and dry.  Neurological:     General: No focal deficit present.     Mental Status: She is alert and oriented to person, place, and time.  Psychiatric:        Mood and Affect: Mood normal.        Behavior: Behavior normal.      UC Treatments / Results  Labs (all labs ordered are listed, but only abnormal results are displayed) Labs Reviewed - No data to display  EKG   Radiology DG Shoulder Left Result Date: 11/05/2023 CLINICAL DATA:  Left shoulder pain radiating down the arm after a fall yesterday. Slip and fall in the shower. EXAM: LEFT SHOULDER - 2+ VIEW COMPARISON:  left humerus 10/09/2018 FINDINGS: Left shoulder appears intact. No evidence of acute fracture or subluxation. No focal bone lesion or bone destruction. Bone cortex  and trabecular architecture appear intact. No radiopaque soft tissue foreign bodies. IMPRESSION: Negative. Electronically Signed   By:  Elsie Gravely M.D.   On: 11/05/2023 18:54    Procedures Procedures (including critical care time)  Medications Ordered in UC Medications  ketorolac  (TORADOL ) 30 MG/ML injection 15 mg (has no administration in time range)    Initial Impression / Assessment and Plan / UC Course  I have reviewed the triage vital signs and the nursing notes.  Pertinent labs & imaging results that were available during my care of the patient were reviewed by me and considered in my medical decision making (see chart for details).     Reviewed exam and symptoms with patient.  No red flags.  X-ray negative.  Discussed likely contusion of shoulder secondary to fall.  Patient was given Toradol  injection in clinic.  Monitored for 10 minutes after injection with no reaction noted and tolerated well.  No NSAIDs for 24 hours and verbalized understanding.  Will start naproxen  Rx tomorrow, 10/7.  Patient has a history of hypertension and ran out of her amlodipine.  States her PCP has retired and she still needs to find a new one.  I refilled her amlodipine for 30 days to get her through till she can get a new PCP.  No chest pain or shortness of breath or headaches at time of evaluation.  Discussed RICE therapy and ER precautions were reviewed. Final Clinical Impressions(s) / UC Diagnoses   Final diagnoses:  Acute pain of left shoulder  Essential hypertension  Contusion of left shoulder, initial encounter     Discharge Instructions      You were given a Toradol  injection in clinic today. Do not take any over the counter NSAID's such as Advil , ibuprofen , Aleve , or naproxen  for 24 hours. You may take tylenol  if needed.  You can start naproxen  twice daily as needed tomorrow after 7 PM.  You may elevate and ice as needed.  I also refilled your amlodipine for 30 days.  Please try to set up with a new PCP for further refills.  Please go to the ER if you develop any worsening symptoms.  I hope you feel better  soon!      ED Prescriptions     Medication Sig Dispense Auth. Provider   amLODipine (NORVASC) 5 MG tablet Take 1 tablet (5 mg total) by mouth daily. 30 tablet Keoni Havey, Jodi R, NP   naproxen  (NAPROSYN ) 375 MG tablet Take 1 tablet (375 mg total) by mouth 2 (two) times daily as needed. 10 tablet Hanz Winterhalter, Jodi R, NP      PDMP not reviewed this encounter.   Loreda Myla SAUNDERS, NP 11/05/23 [phone]

## 2023-11-05 NOTE — ED Triage Notes (Signed)
 Pt c/o L shoulder pain radiating down arm d/t fall yesterday. States slipped in the shower. Has tried OTC meds w/o relief.

## 2023-11-05 NOTE — Discharge Instructions (Addendum)
 You were given a Toradol  injection in clinic today. Do not take any over the counter NSAID's such as Advil , ibuprofen , Aleve , or naproxen  for 24 hours. You may take tylenol  if needed.  You can start naproxen  twice daily as needed tomorrow after 7 PM.  You may elevate and ice as needed.  I also refilled your amlodipine for 30 days.  Please try to set up with a new PCP for further refills.  Please go to the ER if you develop any worsening symptoms.  I hope you feel better soon!

## 2024-01-10 ENCOUNTER — Ambulatory Visit: Payer: Self-pay | Admitting: Physician Assistant

## 2024-01-10 ENCOUNTER — Ambulatory Visit

## 2024-01-10 ENCOUNTER — Ambulatory Visit
Admission: EM | Admit: 2024-01-10 | Discharge: 2024-01-10 | Disposition: A | Discharge status: Home / Self Care | Attending: Physician Assistant | Admitting: Physician Assistant

## 2024-01-10 DIAGNOSIS — J189 Pneumonia, unspecified organism: Secondary | ICD-10-CM | POA: Diagnosis not present

## 2024-01-10 DIAGNOSIS — R6889 Other general symptoms and signs: Secondary | ICD-10-CM | POA: Diagnosis not present

## 2024-01-10 DIAGNOSIS — R509 Fever, unspecified: Secondary | ICD-10-CM

## 2024-01-10 LAB — POC SOFIA SARS ANTIGEN FIA: SARS Coronavirus 2 Ag: NEGATIVE

## 2024-01-10 LAB — POCT INFLUENZA A/B
Influenza A, POC: NEGATIVE
Influenza B, POC: NEGATIVE

## 2024-01-10 MED ORDER — PROMETHAZINE-DM 6.25-15 MG/5ML PO SYRP: 5.0000 mL | ORAL_SOLUTION | Freq: Four times a day (QID) | ORAL | 0 refills | Status: AC | PRN

## 2024-01-10 MED ORDER — AMOXICILLIN-POT CLAVULANATE 875-125 MG PO TABS: 1.0000 | ORAL_TABLET | Freq: Two times a day (BID) | ORAL | 0 refills | Status: AC

## 2024-01-10 MED ORDER — AZITHROMYCIN 250 MG PO TABS: 250.0000 mg | ORAL_TABLET | Freq: Every day | ORAL | 0 refills | Status: AC

## 2024-01-10 NOTE — Discharge Instructions (Addendum)
-   Negative flu and COVID. - Your chest x-ray shows concern for pneumonia.  I sent antibiotics to pharmacy.  Take them all.  I also sent cough medicine.  Increase rest and fluids. - Repeat the x-ray in 3 to 4 weeks to make sure the pneumonia is cleared. - If not breaking fever and feeling somewhat better in the next couple days, worsening breathing, increased weakness, go to ED.

## 2024-01-10 NOTE — ED Triage Notes (Signed)
 Pt c/o temperature of 102, body aches, headaches, cough x3days

## 2024-01-10 NOTE — ED Provider Notes (Signed)
 [facility]    CSN: [id number] Arrival date & time: 01/10/24  9164      History   Chief Complaint Chief Complaint  Patient presents with   Cough   Headache   Generalized Body Aches    HPI Jean Wise is a 28 y.o. female presenting for fever, fatigue, cough, congestion, headaches, and body aches x 2 days. Denies sore throat, ear pain, sinus pain, chest pain, wheezing, shortness of breath, abdominal pain, vomiting or diarrhea.  Patient has been taking over-the-counter meds. No other complaints.   HPI  Past Medical History:  Diagnosis Date   Anemia    Hypertension     Patient Active Problem List   Diagnosis Date Noted   Sore throat 07/06/2021   Encounter for removal and reinsertion of Nexplanon 07/05/2021   Elevated liver enzymes 05/07/2020   Gastroenteritis 06/12/2019   History of COVID-19 01/08/2019   Anxiety 08/16/2018   History of transfusion 08/16/2018   Positive depression screening 04/16/2018   Labor and delivery, indication for care 04/03/2018   Iron deficiency anemia secondary to inadequate dietary iron intake 02/16/2018   Musculoskeletal back pain 02/16/2018   History of abnormal cervical Pap smear 11/07/2017   Indication for care in labor or delivery 05/24/2016   Essential hypertension 06/15/2015    History reviewed. No pertinent surgical history.  OB History     Gravida  2   Para  1   Term      Preterm  1   AB      Living  1      SAB      IAB      Ectopic      Multiple      Live Births  1            Home Medications    Prior to Admission medications  Medication Sig Start Date End Date Taking? Authorizing Provider  amLODipine  (NORVASC ) 5 MG tablet Take 1 tablet (5 mg total) by mouth daily. 11/05/23  Yes Mayer, Jodi R, NP  amoxicillin-clavulanate (AUGMENTIN) 875-125 MG tablet Take 1 tablet by mouth every 12 (twelve) hours for 7 days. 01/10/24 01/17/24 Yes Arvis Jolan NOVAK, PA-C  azithromycin (ZITHROMAX) 250  MG tablet Take 1 tablet (250 mg total) by mouth daily. Take first 2 tablets together, then 1 every day until finished. 01/10/24  Yes Arvis Jolan NOVAK, PA-C  promethazine-dextromethorphan (PROMETHAZINE-DM) 6.25-15 MG/5ML syrup Take 5 mLs by mouth 4 (four) times daily as needed. 01/10/24  Yes Arvis Jolan NOVAK, PA-C  dicyclomine  (BENTYL ) 20 MG tablet Take 1 tablet (20 mg total) by mouth every 8 (eight) hours as needed for spasms (Abdominal cramping). 05/06/20   Ward, Josette SAILOR, DO  fluconazole  (DIFLUCAN ) 150 MG tablet Take 1 tablet by mouth today, then repeat dose in 3 days if symptoms have not fully resolved. 12/21/19   Rodgers, Caitlin J, PA  naproxen  (NAPROSYN ) 375 MG tablet Take 1 tablet (375 mg total) by mouth 2 (two) times daily as needed. 11/05/23   Mayer, Jodi R, NP  predniSONE  (STERAPRED UNI-PAK 21 TAB) 10 MG (21) TBPK tablet Dispense one 6 day pack. Take as directed with food. 05/21/23   Van Knee, MD    Family History Family History  Problem Relation Age of Onset   Hypertension Mother     Social History Social History[1]   Allergies   Patient has no known allergies.   Review of Systems Review of Systems  Constitutional:  Positive  for fatigue and fever. Negative for chills and diaphoresis.  HENT:  Positive for congestion and rhinorrhea. Negative for ear pain, sinus pressure, sinus pain and sore throat.   Respiratory:  Positive for cough. Negative for shortness of breath.   Cardiovascular:  Negative for chest pain.  Gastrointestinal:  Negative for abdominal pain, nausea and vomiting.  Musculoskeletal:  Positive for arthralgias and myalgias.  Skin:  Negative for rash.  Neurological:  Positive for headaches. Negative for weakness.  Hematological:  Negative for adenopathy.     Physical Exam Triage Vital Signs ED Triage Vitals  Encounter Vitals Group     BP 01/10/24 0943 (!) 147/107     Girls Systolic BP Percentile --      Girls Diastolic BP Percentile --      Boys  Systolic BP Percentile --      Boys Diastolic BP Percentile --      Pulse Rate 01/10/24 0943 (!) 103     Resp 01/10/24 0943 16     Temp 01/10/24 0943 98.3 F (36.8 C)     Temp Source 01/10/24 0943 Oral     SpO2 01/10/24 0943 93 %     Weight 01/10/24 0942 190 lb 3.2 oz (86.3 kg)     Height --      Head Circumference --      Peak Flow --      Pain Score 01/10/24 0942 8     Pain Loc --      Pain Education --      Exclude from Growth Chart --    No data found.  Updated Vital Signs BP (!) 147/107 (BP Location: Left Arm)   Pulse (!) 103   Temp 98.3 F (36.8 C) (Oral)   Resp 16   Wt 190 lb 3.2 oz (86.3 kg)   LMP 12/11/2023   SpO2 93%   BMI 34.79 kg/m   Physical Exam Vitals and nursing note reviewed.  Constitutional:      General: She is not in acute distress.    Appearance: Normal appearance. She is ill-appearing. She is not toxic-appearing.  HENT:     Head: Normocephalic and atraumatic.     Nose: Congestion present.     Mouth/Throat:     Mouth: Mucous membranes are moist.     Pharynx: Oropharynx is clear.  Eyes:     General: No scleral icterus.       Right eye: No discharge.        Left eye: No discharge.     Conjunctiva/sclera: Conjunctivae normal.  Cardiovascular:     Rate and Rhythm: Regular rhythm. Tachycardia present.     Heart sounds: Normal heart sounds.  Pulmonary:     Effort: Pulmonary effort is normal. No respiratory distress.     Breath sounds: Normal breath sounds.  Musculoskeletal:     Cervical back: Neck supple.  Skin:    General: Skin is dry.  Neurological:     General: No focal deficit present.     Mental Status: She is alert. Mental status is at baseline.     Motor: No weakness.     Gait: Gait normal.  Psychiatric:        Mood and Affect: Mood normal.        Behavior: Behavior normal.      UC Treatments / Results  Labs (all labs ordered are listed, but only abnormal results are displayed) Labs Reviewed  POC SOFIA SARS ANTIGEN FIA -  Normal  POCT INFLUENZA A/B - Normal    EKG   Radiology No results found.  Procedures Procedures (including critical care time)  Medications Ordered in UC Medications - No data to display  Initial Impression / Assessment and Plan / UC Course  I have reviewed the triage vital signs and the nursing notes.  Pertinent labs & imaging results that were available during my care of the patient were reviewed by me and considered in my medical decision making (see chart for details).   28 year old female presents for 2-day history of fever, fatigue, headaches and bodyaches, cough congestion.  No associated sore throat, chest pain or shortness of breath.  She is ill-appearing but nontoxic.  Heart rate elevated 103 bpm.  Oxygen 93%.  No acute distress.  Has nasal congestion.  Throat clear.  Chest clear.  Rapid flu and COVID testing are negative.  Chest x-ray obtained to evaluate for possible pneumonia given negative flu and COVID testing.  X-ray shows right-sided pneumonia.  Reviewed with patient.  Sent Augmentin and azithromycin to pharmacy as well as Promethazine DM.  Encouraged increasing rest and fluids.  Advised she should start to feel little bit better in the next couple days but cough can linger.  Discussed going to ED if not breaking fever, worsening breathing, excetra.  Reviewed repeating x-ray in 3 to 4 weeks to make sure pneumonia has cleared.  Acute illness with systemic symptoms.  Chest x-ray over read negative per radiologist.  No change to treatment plan.  Clinical picture is consistent with pneumonia and x-ray is concerning.   Final Clinical Impressions(s) / UC Diagnoses   Final diagnoses:  Flu-like symptoms  Pneumonia of right lung due to infectious organism, unspecified part of lung  Fever, unspecified     Discharge Instructions      - Negative flu and COVID. - Your chest x-ray shows concern for pneumonia.  I sent antibiotics to pharmacy.  Take them all.  I also  sent cough medicine.  Increase rest and fluids. - Repeat the x-ray in 3 to 4 weeks to make sure the pneumonia is cleared. - If not breaking fever and feeling somewhat better in the next couple days, worsening breathing, increased weakness, go to ED.     ED Prescriptions     Medication Sig Dispense Auth. Provider   amoxicillin-clavulanate (AUGMENTIN) 875-125 MG tablet Take 1 tablet by mouth every 12 (twelve) hours for 7 days. 14 tablet Arvis Huxley B, PA-C   azithromycin (ZITHROMAX) 250 MG tablet Take 1 tablet (250 mg total) by mouth daily. Take first 2 tablets together, then 1 every day until finished. 6 tablet Arvis Huxley NOVAK, PA-C   promethazine-dextromethorphan (PROMETHAZINE-DM) 6.25-15 MG/5ML syrup Take 5 mLs by mouth 4 (four) times daily as needed. 118 mL Arvis Huxley NOVAK, PA-C      PDMP not reviewed this encounter.     [1]  Social History Tobacco Use   Smoking status: Never   Smokeless tobacco: Never  Vaping Use   Vaping status: Never Used  Substance Use Topics   Alcohol use: No   Drug use: Never     Arvis Huxley NOVAK, PA-C 01/10/24 1122
